# Patient Record
Sex: Female | Born: 2000 | Race: Black or African American | Hispanic: No | Marital: Single | Smoking: Never smoker
Health system: Southern US, Community
[De-identification: ages and names within clinical notes are randomized; demographics above are authoritative.]

## PROBLEM LIST (undated history)

## (undated) DIAGNOSIS — E301 Precocious puberty: Secondary | ICD-10-CM

## (undated) DIAGNOSIS — S83004D Unspecified dislocation of right patella, subsequent encounter: Secondary | ICD-10-CM

## (undated) HISTORY — PX: SUPPRELIN IMPLANT: SHX5166

## (undated) HISTORY — PX: SUPPRELIN REMOVAL: SHX6104

---

## 2019-07-16 ENCOUNTER — Ambulatory Visit (INDEPENDENT_AMBULATORY_CARE_PROVIDER_SITE_OTHER): Payer: PRIVATE HEALTH INSURANCE | Admitting: Orthopedic Surgery

## 2019-07-16 DIAGNOSIS — M25461 Effusion, right knee: Secondary | ICD-10-CM

## 2019-07-19 ENCOUNTER — Other Ambulatory Visit: Payer: Self-pay

## 2019-07-19 ENCOUNTER — Encounter: Payer: Self-pay | Admitting: Orthopedic Surgery

## 2019-07-19 ENCOUNTER — Telehealth: Payer: Self-pay

## 2019-07-19 ENCOUNTER — Ambulatory Visit (INDEPENDENT_AMBULATORY_CARE_PROVIDER_SITE_OTHER): Payer: PRIVATE HEALTH INSURANCE | Admitting: Orthopedic Surgery

## 2019-07-19 DIAGNOSIS — M25561 Pain in right knee: Secondary | ICD-10-CM

## 2019-07-19 NOTE — Telephone Encounter (Signed)
Order for this [facility] student put in for MRI right knee eval for tear

## 2019-07-19 NOTE — Telephone Encounter (Signed)
Done. thanks

## 2019-07-19 NOTE — Telephone Encounter (Signed)
Please dictate ASAP

## 2019-07-19 NOTE — Progress Notes (Signed)
Office Visit Note   Patient: Mary [name]           Date of Birth: 2001-08-07           MRN: [id number] Visit Date: 07/19/2019 Requested by: No referring provider defined for this encounter. PCP: Patient, No Pcp Per  Subjective: Chief Complaint  Patient presents with  . Right Knee - Pain    HPI: Patient presents with right knee pain.  She sustained an injury playing basketball about 5 days ago.  Went up for a rebound and came down and sustained a valgus type collapse with immediate onset of right knee swelling.  She was unable to weight-bear.  Evaluation in the training room demonstrated intact radiographs on the fluoroscopy machine with effusion and possible ACL laxity.  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  She has not been able to weight-bear.  No personal or family history of DVT or pulmonary embolism.              ROS: All systems reviewed are negative as they relate to the chief complaint within the history of present illness.  Patient denies  fevers or chills.   Assessment & Plan: Visit Diagnoses:  1. Right knee pain, unspecified chronicity     Plan: Impression is right knee pain with effusion following valgus collapse type injury.  We aspirated the knee today and got about 40 cc of bloody fluid out.  After that anesthetic injection into the knee joint for the aspiration she does have some anterior laxity.  Collaterals feel stable.  Plan is MRI scan to evaluate source of bloody effusion.  Could be patellar instability ACL tear or meniscal pathology or occult lateral tibial plateau fracture.  She did have a relatively acute onset of swelling.  No calf tenderness today.  Follow-Up Instructions: Return for after MRI.   Orders:  Orders Placed This Encounter  Procedures  . MR Knee Right w/o contrast   No orders of the defined types were placed in this encounter.     Procedures: No procedures performed   Clinical Data: No  additional findings.  Objective: Vital Signs: There were no vitals taken for this visit.  Physical Exam:   Constitutional: Patient appears well-developed HEENT:  Head: Normocephalic Eyes:EOM are normal Neck: Normal range of motion Cardiovascular: Normal rate Pulmonary/chest: Effort normal Neurologic: Patient is alert Skin: Skin is warm Psychiatric: Patient has normal mood and affect    Ortho Exam: Ortho exam demonstrates palpable pedal pulses with intact ankle dorsiflexion.  Extensor mechanism is intact.  Only has about 40 degrees of flexion but does have full extension.  Collaterals feel stable on exam but it is somewhat guarded.  She does have some ACL laxity on the right compared to the left.  Negative patellar apprehension and no discrete tenderness at the medial aspect of the patella but there is some tenderness over the medial epicondyle.  Specialty Comments:  No specialty comments available.  Imaging: No results found.   PMFS History: There are no active problems to display for this patient.  History reviewed. No pertinent past medical history.  History reviewed. No pertinent family history.  History reviewed. No pertinent surgical history. Social History   Occupational History  . Not on file  Tobacco Use  . Smoking status: Not on file  Substance and Sexual Activity  . Alcohol use: Not on file  . Drug use: Not on file  . Sexual activity: Not on file

## 2019-07-19 NOTE — Telephone Encounter (Signed)
I need this office note ASAP please if you will tell him, so I can complete the auth.   I have started this case for PA with [employer] - case # [id number].

## 2019-07-20 ENCOUNTER — Encounter: Payer: Self-pay | Admitting: Orthopedic Surgery

## 2019-07-20 NOTE — Telephone Encounter (Signed)
See note from Dr Dean.  

## 2019-07-20 NOTE — Telephone Encounter (Signed)
Per online, clinicals received pending review- auth pending.  Will follow up

## 2019-07-20 NOTE — Progress Notes (Signed)
   Post-Op Visit Note   Patient: Mary [name]           Date of Birth: 31-Jan-2001           MRN: [id number] Visit Date: 07/16/2019 PCP: Patient, No Pcp Per   Assessment & Plan:  Chief Complaint: No chief complaint on file.  Visit Diagnoses:  1. Effusion, right knee     Plan: Patient presents with injury to the right knee.  She is a Technical brewer.  Has a history of left knee patellar subluxation.  No history of right knee patellar subluxation.  She landed on teammates foot about 3 days ago.  Radiographs reviewed are negative for obvious fracture.  She did have immediate onset of swelling which did go down the leg into the ankle region.  She has been nonweightbearing.  On exam she has moderate effusion with some laxity to ACL testing but is difficult to assess because of her guarding.  Pedal pulses are palpable.  Did have a little bit of paresthesias in the dorsal foot at the onset of injury but those have resolved.  Ankle dorsiflexion strength is symmetric to the left-hand side at this time.  No real patellar apprehension but she does have some medial patellar tenderness but no tenderness over the medial epicondyle.  Range of motion is full extension to only about 40 degrees of flexion.  Impression is right knee injury with effusion.  Could be patellar dislocation ACL injury meniscal injury or occult lateral tibial plateau fracture.  Was a valgus type collapse of the knee when she landed on the teammates foot.  Plan MRI scan with possible aspiration of the knee later in clinic this week.  Need MRI scan to evaluate the source of the effusion.  We will see her back after that study in the training room.  Follow-Up Instructions: Return for after MRI.   Orders:  No orders of the defined types were placed in this encounter.  No orders of the defined types were placed in this encounter.   Imaging: No results found.  PMFS History: There are no active problems to display for  this patient.  History reviewed. No pertinent past medical history.  History reviewed. No pertinent family history.  History reviewed. No pertinent surgical history. Social History   Occupational History  . Not on file  Tobacco Use  . Smoking status: Never Smoker  . Smokeless tobacco: Never Used  Substance and Sexual Activity  . Alcohol use: Not on file  . Drug use: Not on file  . Sexual activity: Not on file

## 2019-07-23 NOTE — Telephone Encounter (Signed)
When I look online it says nurse review, but there is also a P2P availability button.  First time I have used [employer] and not gotten an auth soon, so I am not sure if I am doing something wrong.  Case number is below.  I have not reached out to [facility] yet but that is where I was going to schedule it.  This one came to me through a regular appt, not a request from an AT.  So I guess we would call the patient directly to schedule.  If you will look into and complete this one I would most greatly appreciate you!!  Thanks-

## 2019-08-01 ENCOUNTER — Other Ambulatory Visit: Payer: Self-pay

## 2019-08-01 ENCOUNTER — Ambulatory Visit (INDEPENDENT_AMBULATORY_CARE_PROVIDER_SITE_OTHER): Payer: PRIVATE HEALTH INSURANCE | Admitting: Orthopedic Surgery

## 2019-08-01 DIAGNOSIS — S83004D Unspecified dislocation of right patella, subsequent encounter: Secondary | ICD-10-CM | POA: Diagnosis not present

## 2019-08-02 ENCOUNTER — Encounter: Payer: Self-pay | Admitting: Orthopedic Surgery

## 2019-08-02 NOTE — Progress Notes (Signed)
Office Visit Note   Patient: Mary [name]           Date of Birth: May 23, 2001           MRN: [id number] Visit Date: 08/01/2019 Requested by: No referring provider defined for this encounter. PCP: Patient, No Pcp Per  Subjective: Chief Complaint  Patient presents with  . Follow-up    HPI: Patient presents for evaluation of her right knee.  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  Evaluated in the training room with effusion.  Differential diagnosis at that time was ACL injury versus patellar dislocation versus tibial plateau fracture.  Plain radiographs normal.  MRI scan does show evidence of transient patellar dislocation with lateral femoral condyle osteochondral injury as well as patella chondral injury.  The patella chondral injury is pretty significant crater involving the lateral facet of the patella.  Loose chondral fragment is present within this crater.  Does not look like it is an osteochondral fragment.  Loose bodies also present in the knee from the injury.            ROS: All systems reviewed are negative as they relate to the chief complaint within the history of present illness.  Patient denies  fevers or chills.   Assessment & Plan: Visit Diagnoses:  1. Patellar dislocation, right, subsequent encounter     Plan: Impression is right knee patellar dislocation with fairly reasonable feeling medial patellofemoral ligament today.  Effusion is present and she is achieving a range of motion of about 80 degrees.  Hard to know exactly the size of this osteochondral defect as well as the amount of articular surface involved in both the patella and the lateral femoral condyle.  Plan at this time is arthroscopic evaluation with removal of loose body and better sizing of the lesion.  She may be a candidate for bio unipatellar resurfacing.  Bio cartilage would be another option for the patella.  Another?  For this patient is this nature and size  of the chondral defect on the lateral femoral condyle which is not really discernible from the MRI scan at this time.  Plan for arthroscopic evaluation with removal of loose bodies within the next week or 2.  I do want the patient to work on achieving a little bit more flexion prior to surgery.  Follow-Up Instructions: No follow-ups on file.   Orders:  No orders of the defined types were placed in this encounter.  No orders of the defined types were placed in this encounter.     Procedures: No procedures performed   Clinical Data: No additional findings.  Objective: Vital Signs: There were no vitals taken for this visit.  Physical Exam:   Constitutional: Patient appears well-developed HEENT:  Head: Normocephalic Eyes:EOM are normal Neck: Normal range of motion Cardiovascular: Normal rate Pulmonary/chest: Effort normal Neurologic: Patient is alert Skin: Skin is warm Psychiatric: Patient has normal mood and affect    Ortho Exam: Ortho exam demonstrates mild effusion right knee.  Extensor mechanism is intact.  She has about 85 degrees of flexion which is an improvement.  Does have full extension.  Is able to weight-bear.  Collaterals are stable.  MRI scan is reviewed and it does show intact menisci and ACL and PCL with evidence of patellar dislocation.  Medial patellofemoral ligament looks stretched.  Not much in the way of tenderness over the medial femoral epicondyle or medial aspect of the patella.  Some patellar apprehension today but in general she does not have as much lateral laxity of the patella as I would expect with this magnitude of injury.  Specialty Comments:  No specialty comments available.  Imaging: No results found.   PMFS History: There are no active problems to display for this patient.  No past medical history on file.  No family history on file.  No past surgical history on file. Social History   Occupational History  . Not on file  Tobacco  Use  . Smoking status: Never Smoker  . Smokeless tobacco: Never Used  Substance and Sexual Activity  . Alcohol use: Not on file  . Drug use: Not on file  . Sexual activity: Not on file

## 2019-08-07 ENCOUNTER — Other Ambulatory Visit: Payer: Self-pay

## 2019-08-08 ENCOUNTER — Other Ambulatory Visit: Payer: Self-pay

## 2019-08-08 ENCOUNTER — Encounter (HOSPITAL_BASED_OUTPATIENT_CLINIC_OR_DEPARTMENT_OTHER): Payer: Self-pay | Admitting: *Deleted

## 2019-08-09 ENCOUNTER — Encounter (HOSPITAL_BASED_OUTPATIENT_CLINIC_OR_DEPARTMENT_OTHER)
Admission: RE | Admit: 2019-08-09 | Discharge: 2019-08-09 | Disposition: A | Discharge status: Home / Self Care | Payer: 59 | Source: Ambulatory Visit | Attending: Orthopedic Surgery | Admitting: Orthopedic Surgery

## 2019-08-09 DIAGNOSIS — M2341 Loose body in knee, right knee: Secondary | ICD-10-CM | POA: Diagnosis not present

## 2019-08-09 DIAGNOSIS — X58XXXA Exposure to other specified factors, initial encounter: Secondary | ICD-10-CM | POA: Diagnosis not present

## 2019-08-09 DIAGNOSIS — S83004A Unspecified dislocation of right patella, initial encounter: Secondary | ICD-10-CM | POA: Diagnosis present

## 2019-08-09 DIAGNOSIS — Z01812 Encounter for preprocedural laboratory examination: Secondary | ICD-10-CM | POA: Insufficient documentation

## 2019-08-09 DIAGNOSIS — M948X6 Other specified disorders of cartilage, lower leg: Secondary | ICD-10-CM | POA: Insufficient documentation

## 2019-08-09 LAB — CBC
HCT: 35.7 % — ABNORMAL LOW (ref 36.0–46.0)
Hemoglobin: 11.2 g/dL — ABNORMAL LOW (ref 12.0–15.0)
MCH: 26.9 pg (ref 26.0–34.0)
MCHC: 31.4 g/dL (ref 30.0–36.0)
MCV: 85.8 fL (ref 80.0–100.0)
Platelets: 360 10*3/uL (ref 150–400)
RBC: 4.16 MIL/uL (ref 3.87–5.11)
RDW: 15 % (ref 11.5–15.5)
WBC: 6.8 10*3/uL (ref 4.0–10.5)
nRBC: 0 % (ref 0.0–0.2)

## 2019-08-09 LAB — BASIC METABOLIC PANEL
Anion gap: 10 (ref 5–15)
BUN: 10 mg/dL (ref 6–20)
CO2: 25 mmol/L (ref 22–32)
Calcium: 9.6 mg/dL (ref 8.9–10.3)
Chloride: 105 mmol/L (ref 98–111)
Creatinine, Ser: 0.8 mg/dL (ref 0.44–1.00)
GFR calc Af Amer: >60 mL/min (ref >60)
GFR calc non Af Amer: >60 mL/min (ref >60)
Glucose, Bld: 76 mg/dL (ref 70–99)
Potassium: 4.4 mmol/L (ref 3.5–5.1)
Sodium: 140 mmol/L (ref 135–145)

## 2019-08-09 LAB — POCT PREGNANCY, URINE: Preg Test, Ur: NEGATIVE

## 2019-08-09 NOTE — Progress Notes (Signed)

## 2019-08-09 NOTE — Pre-Procedure Instructions (Signed)
I called pt to let her know that she does not need to have another covid test prior to the procedure. She verbalized understanding.

## 2019-08-09 NOTE — Pre-Procedure Instructions (Signed)
Received faxed Covid test result from patient's trainer on a [facility] form. I spoke with Osker Mason, MD, Assistant Director of [place], in order to confirm the positive result, the date of the test and the type of test. The test performed was done onsite at [facility], was a [employer] #[employer] [facility] Covid 2 Rapid Antigen Test. Dr. Olevia Bowens confirmed that patient experienced only mild symptoms with her Covid course. Information regarding the covid test type was given to IP speciatlist, Cheral Marker, who then discussed with Dr.Cynthia Graylon Good. Dr. Graylon Good confirmed that we could use that result without any need to retest. Documentation form placed on patient's chart.Case reviewed with Dr Roger Kill, anesthesiologist, who is comfortable using the Covid result with IP approval.

## 2019-08-10 ENCOUNTER — Other Ambulatory Visit (HOSPITAL_COMMUNITY): Payer: PRIVATE HEALTH INSURANCE

## 2019-08-14 ENCOUNTER — Ambulatory Visit (HOSPITAL_BASED_OUTPATIENT_CLINIC_OR_DEPARTMENT_OTHER)
Admission: RE | Admit: 2019-08-14 | Discharge: 2019-08-14 | Disposition: A | Discharge status: Home / Self Care | Payer: 59 | Attending: Orthopedic Surgery | Admitting: Orthopedic Surgery

## 2019-08-14 ENCOUNTER — Ambulatory Visit (HOSPITAL_BASED_OUTPATIENT_CLINIC_OR_DEPARTMENT_OTHER): Payer: 59 | Admitting: Anesthesiology

## 2019-08-14 ENCOUNTER — Other Ambulatory Visit: Payer: Self-pay

## 2019-08-14 ENCOUNTER — Encounter (HOSPITAL_BASED_OUTPATIENT_CLINIC_OR_DEPARTMENT_OTHER): Payer: Self-pay | Admitting: Orthopedic Surgery

## 2019-08-14 ENCOUNTER — Encounter: Payer: Self-pay | Admitting: Orthopedic Surgery

## 2019-08-14 ENCOUNTER — Encounter (HOSPITAL_BASED_OUTPATIENT_CLINIC_OR_DEPARTMENT_OTHER)
Admission: RE | Disposition: A | Discharge status: Home / Self Care | Payer: Self-pay | Source: Home / Self Care | Attending: Orthopedic Surgery

## 2019-08-14 DIAGNOSIS — S83004A Unspecified dislocation of right patella, initial encounter: Secondary | ICD-10-CM | POA: Diagnosis not present

## 2019-08-14 DIAGNOSIS — S83281D Other tear of lateral meniscus, current injury, right knee, subsequent encounter: Secondary | ICD-10-CM | POA: Diagnosis not present

## 2019-08-14 HISTORY — DX: Precocious puberty: E30.1

## 2019-08-14 HISTORY — PX: KNEE ARTHROSCOPY: SHX127

## 2019-08-14 SURGERY — ARTHROSCOPY, KNEE
Anesthesia: General | Site: Knee | Laterality: Right

## 2019-08-14 MED ORDER — MEPERIDINE HCL 25 MG/ML IJ SOLN: 6.2500 mg | INTRAMUSCULAR | Status: DC | PRN

## 2019-08-14 MED ORDER — MORPHINE SULFATE (PF) 4 MG/ML IV SOLN
INTRAVENOUS | Status: DC | PRN
  Administered 2019-08-14: 8 mg

## 2019-08-14 MED ORDER — FENTANYL CITRATE (PF) 100 MCG/2ML IJ SOLN
INTRAMUSCULAR | Status: AC
  Filled 2019-08-14: qty 2

## 2019-08-14 MED ORDER — KETOROLAC TROMETHAMINE 30 MG/ML IJ SOLN
INTRAMUSCULAR | Status: DC | PRN
  Administered 2019-08-14: 30 mg via INTRAVENOUS

## 2019-08-14 MED ORDER — PROPOFOL 10 MG/ML IV BOLUS
INTRAVENOUS | Status: AC
  Filled 2019-08-14: qty 20

## 2019-08-14 MED ORDER — METHYLPREDNISOLONE ACETATE 40 MG/ML IJ SUSP
INTRAMUSCULAR | Status: AC
  Filled 2019-08-14: qty 1

## 2019-08-14 MED ORDER — ASPIRIN EC 81 MG PO TBEC: 81.0000 mg | DELAYED_RELEASE_TABLET | Freq: Every day | ORAL | 0 refills | Status: DC

## 2019-08-14 MED ORDER — ONDANSETRON HCL 4 MG/2ML IJ SOLN
INTRAMUSCULAR | Status: DC | PRN
  Administered 2019-08-14: 4 mg via INTRAVENOUS

## 2019-08-14 MED ORDER — MIDAZOLAM HCL 5 MG/5ML IJ SOLN
INTRAMUSCULAR | Status: DC | PRN
  Administered 2019-08-14: 2 mg via INTRAVENOUS

## 2019-08-14 MED ORDER — OXYCODONE HCL 5 MG PO TABS: 5.0000 mg | ORAL_TABLET | ORAL | 0 refills | Status: DC | PRN

## 2019-08-14 MED ORDER — DEXMEDETOMIDINE HCL IN NACL 200 MCG/50ML IV SOLN
INTRAVENOUS | Status: DC | PRN
  Administered 2019-08-14: 4 ug via INTRAVENOUS
  Administered 2019-08-14 (×3): 8 ug via INTRAVENOUS
  Administered 2019-08-14: 12 ug via INTRAVENOUS

## 2019-08-14 MED ORDER — CHLORHEXIDINE GLUCONATE 4 % EX LIQD: 60.0000 mL | Freq: Once | CUTANEOUS | Status: DC

## 2019-08-14 MED ORDER — ONDANSETRON HCL 4 MG/2ML IJ SOLN
INTRAMUSCULAR | Status: AC
  Filled 2019-08-14: qty 4

## 2019-08-14 MED ORDER — MIDAZOLAM HCL 2 MG/2ML IJ SOLN: 1.0000 mg | INTRAMUSCULAR | Status: DC | PRN

## 2019-08-14 MED ORDER — PROMETHAZINE HCL 25 MG/ML IJ SOLN: 6.2500 mg | INTRAMUSCULAR | Status: DC | PRN

## 2019-08-14 MED ORDER — LIDOCAINE 2% (20 MG/ML) 5 ML SYRINGE
INTRAMUSCULAR | Status: DC | PRN
  Administered 2019-08-14: 80 mg via INTRAVENOUS

## 2019-08-14 MED ORDER — CEFAZOLIN SODIUM-DEXTROSE 2-4 GM/100ML-% IV SOLN
INTRAVENOUS | Status: AC
  Filled 2019-08-14: qty 100

## 2019-08-14 MED ORDER — FENTANYL CITRATE (PF) 100 MCG/2ML IJ SOLN
INTRAMUSCULAR | Status: DC | PRN
  Administered 2019-08-14: 50 ug via INTRAVENOUS
  Administered 2019-08-14 (×2): 25 ug via INTRAVENOUS
  Administered 2019-08-14: 50 ug via INTRAVENOUS
  Administered 2019-08-14: 25 ug via INTRAVENOUS
  Administered 2019-08-14: 50 ug via INTRAVENOUS
  Administered 2019-08-14 (×3): 25 ug via INTRAVENOUS

## 2019-08-14 MED ORDER — OXYCODONE HCL 5 MG PO TABS: 5.0000 mg | ORAL_TABLET | Freq: Once | ORAL | Status: DC | PRN

## 2019-08-14 MED ORDER — SUCCINYLCHOLINE CHLORIDE 200 MG/10ML IV SOSY
PREFILLED_SYRINGE | INTRAVENOUS | Status: AC
  Filled 2019-08-14: qty 10

## 2019-08-14 MED ORDER — LACTATED RINGERS IV SOLN: INTRAVENOUS | Status: DC

## 2019-08-14 MED ORDER — BUPIVACAINE HCL (PF) 0.5 % IJ SOLN
INTRAMUSCULAR | Status: DC | PRN
  Administered 2019-08-14: 20 mL

## 2019-08-14 MED ORDER — LIDOCAINE-EPINEPHRINE 1 %-1:100000 IJ SOLN
INTRAMUSCULAR | Status: DC | PRN
  Administered 2019-08-14: 20 mL

## 2019-08-14 MED ORDER — SODIUM CHLORIDE 0.9 % IR SOLN
Status: DC | PRN
  Administered 2019-08-14: 4 mL

## 2019-08-14 MED ORDER — LIDOCAINE-EPINEPHRINE 1 %-1:100000 IJ SOLN
INTRAMUSCULAR | Status: AC
  Filled 2019-08-14: qty 1

## 2019-08-14 MED ORDER — PROPOFOL 10 MG/ML IV BOLUS
INTRAVENOUS | Status: DC | PRN
  Administered 2019-08-14: 50 mg via INTRAVENOUS
  Administered 2019-08-14: 200 mg via INTRAVENOUS

## 2019-08-14 MED ORDER — CEFAZOLIN SODIUM-DEXTROSE 2-4 GM/100ML-% IV SOLN
2.0000 g | INTRAVENOUS | Status: AC
  Administered 2019-08-14: 2 g via INTRAVENOUS

## 2019-08-14 MED ORDER — DEXAMETHASONE SODIUM PHOSPHATE 10 MG/ML IJ SOLN
INTRAMUSCULAR | Status: DC | PRN
  Administered 2019-08-14: 10 mg via INTRAVENOUS

## 2019-08-14 MED ORDER — OXYCODONE HCL 5 MG/5ML PO SOLN: 5.0000 mg | Freq: Once | ORAL | Status: DC | PRN

## 2019-08-14 MED ORDER — EPHEDRINE 5 MG/ML INJ
INTRAVENOUS | Status: AC
  Filled 2019-08-14: qty 30

## 2019-08-14 MED ORDER — MORPHINE SULFATE (PF) 4 MG/ML IV SOLN
INTRAVENOUS | Status: AC
  Filled 2019-08-14: qty 2

## 2019-08-14 MED ORDER — FENTANYL CITRATE (PF) 100 MCG/2ML IJ SOLN: 50.0000 ug | INTRAMUSCULAR | Status: DC | PRN

## 2019-08-14 MED ORDER — METHOCARBAMOL 500 MG PO TABS: 500.0000 mg | ORAL_TABLET | Freq: Four times a day (QID) | ORAL | 0 refills | Status: DC

## 2019-08-14 MED ORDER — HYDROMORPHONE HCL 1 MG/ML IJ SOLN: 0.2500 mg | INTRAMUSCULAR | Status: DC | PRN

## 2019-08-14 MED ORDER — MIDAZOLAM HCL 2 MG/2ML IJ SOLN
INTRAMUSCULAR | Status: AC
  Filled 2019-08-14: qty 2

## 2019-08-14 MED ORDER — PHENYLEPHRINE 40 MCG/ML (10ML) SYRINGE FOR IV PUSH (FOR BLOOD PRESSURE SUPPORT)
PREFILLED_SYRINGE | INTRAVENOUS | Status: AC
  Filled 2019-08-14: qty 10

## 2019-08-14 SURGICAL SUPPLY — 57 items
BANDAGE ESMARK 6X9 LF (GAUZE/BANDAGES/DRESSINGS) IMPLANT
BLADE EXCALIBUR 4.0MM X 13CM (MISCELLANEOUS)
BLADE EXCALIBUR 4.0X13 (MISCELLANEOUS) IMPLANT
BLADE SURG 15 STRL LF DISP TIS (BLADE) IMPLANT
BLADE SURG 15 STRL SS (BLADE)
BNDG ELASTIC 4X5.8 VLCR STR LF (GAUZE/BANDAGES/DRESSINGS) ×3 IMPLANT
BNDG ELASTIC 6X5.8 VLCR STR LF (GAUZE/BANDAGES/DRESSINGS) ×3 IMPLANT
BNDG ESMARK 6X9 LF (GAUZE/BANDAGES/DRESSINGS)
BURR OVAL 8 FLU 4.0MM X 13CM (MISCELLANEOUS) ×1
BURR OVAL 8 FLU 4.0X13 (MISCELLANEOUS) ×2 IMPLANT
COVER WAND RF STERILE (DRAPES) IMPLANT
CUFF TOURN SGL QUICK 34 (TOURNIQUET CUFF)
CUFF TRNQT CYL 34X4.125X (TOURNIQUET CUFF) IMPLANT
DISSECTOR  3.8MM X 13CM (MISCELLANEOUS) ×2
DISSECTOR 3.8MM X 13CM (MISCELLANEOUS) ×1 IMPLANT
DRAPE ARTHROSCOPY W/POUCH 90 (DRAPES) ×3 IMPLANT
DRAPE INCISE IOBAN 66X45 STRL (DRAPES) IMPLANT
DRAPE U-SHAPE 47X51 STRL (DRAPES) ×6 IMPLANT
DRAPE U-SHAPE 76X120 STRL (DRAPES) ×3 IMPLANT
DRSG TEGADERM 2-3/8X2-3/4 SM (GAUZE/BANDAGES/DRESSINGS) ×3 IMPLANT
DRSG TEGADERM 4X4.75 (GAUZE/BANDAGES/DRESSINGS) ×6 IMPLANT
DURAPREP 26ML APPLICATOR (WOUND CARE) ×3 IMPLANT
DW OUTFLOW CASSETTE/TUBE SET (MISCELLANEOUS) ×3 IMPLANT
ELECT REM PT RETURN 9FT ADLT (ELECTROSURGICAL)
ELECTRODE REM PT RTRN 9FT ADLT (ELECTROSURGICAL) IMPLANT
EXCALIBUR 3.8MM X 13CM (MISCELLANEOUS) IMPLANT
GAUZE 4X4 16PLY RFD (DISPOSABLE) IMPLANT
GAUZE SPONGE 4X4 12PLY STRL (GAUZE/BANDAGES/DRESSINGS) ×3 IMPLANT
GAUZE XEROFORM 1X8 LF (GAUZE/BANDAGES/DRESSINGS) ×3 IMPLANT
GAUZE XEROFORM 5X9 LF (GAUZE/BANDAGES/DRESSINGS) ×3 IMPLANT
GLOVE BIOGEL PI IND STRL 8 (GLOVE) ×1 IMPLANT
GLOVE BIOGEL PI INDICATOR 8 (GLOVE) ×2
GLOVE SURG ORTHO 8.0 STRL STRW (GLOVE) ×3 IMPLANT
GOWN STRL REUS W/ TWL LRG LVL3 (GOWN DISPOSABLE) ×1 IMPLANT
GOWN STRL REUS W/ TWL XL LVL3 (GOWN DISPOSABLE) ×1 IMPLANT
GOWN STRL REUS W/TWL LRG LVL3 (GOWN DISPOSABLE) ×2
GOWN STRL REUS W/TWL XL LVL3 (GOWN DISPOSABLE) ×2
HOLDER KNEE FOAM BLUE (MISCELLANEOUS) IMPLANT
KIT CARTILAGE BIOPSY (MISCELLANEOUS) ×3 IMPLANT
KNEE WRAP E Z 3 GEL PACK (MISCELLANEOUS) ×3 IMPLANT
MANIFOLD NEPTUNE II (INSTRUMENTS) ×3 IMPLANT
NDL SAFETY ECLIPSE 18X1.5 (NEEDLE) ×2 IMPLANT
NEEDLE HYPO 18GX1.5 BLUNT FILL (NEEDLE) ×3 IMPLANT
NEEDLE HYPO 18GX1.5 SHARP (NEEDLE) ×4
PACK ARTHROSCOPY DSU (CUSTOM PROCEDURE TRAY) ×3 IMPLANT
PACK BASIN DAY SURGERY FS (CUSTOM PROCEDURE TRAY) ×3 IMPLANT
PADDING CAST COTTON 6X4 STRL (CAST SUPPLIES) ×3 IMPLANT
PENCIL SMOKE EVACUATOR (MISCELLANEOUS) IMPLANT
PORT APPOLLO RF 90DEGREE MULTI (SURGICAL WAND) IMPLANT
SUT ETHILON 3 0 PS 1 (SUTURE) ×3 IMPLANT
SUT MENISCAL KIT (KITS) IMPLANT
SUT VIC AB 3-0 FS2 27 (SUTURE) IMPLANT
SYR 5ML LL (SYRINGE) ×3 IMPLANT
SYR TB 1ML LL NO SAFETY (SYRINGE) ×3 IMPLANT
TOWEL GREEN STERILE FF (TOWEL DISPOSABLE) ×3 IMPLANT
TRAY DSU PREP LF (CUSTOM PROCEDURE TRAY) ×3 IMPLANT
TUBING ARTHROSCOPY IRRIG 16FT (MISCELLANEOUS) ×3 IMPLANT

## 2019-08-14 NOTE — H&P (Signed)
Mary [name] is an 18 y.o. female.   Chief Complaint: Right knee chondral defect and patellar instability HPI: Patient presents with right knee patellar dislocation approximately 4 weeks ago.  MRI scan confirms chondral defect and possible osteochondral defect of the patella with loose body within the knee joint lateral gutter.  Patient has had instability on the left patellofemoral joint.  This episode of instability in the right knee was more severe than previously.  She denies any personal or family history of DVT or pulmonary embolism.  Past Medical History:  Diagnosis Date  . Precocious puberty     Past Surgical History:  Procedure Laterality Date  . SUPPRELIN IMPLANT    . SUPPRELIN REMOVAL      History reviewed. No pertinent family history. Social History:  reports that she has never smoked. She has never used smokeless tobacco. She reports that she does not drink alcohol or use drugs.  Allergies: No Known Allergies  No medications prior to admission.    No results found for this or any previous visit (from the past 48 hour(s)). No results found.  Review of Systems  Musculoskeletal: Positive for arthralgias.  All other systems reviewed and are negative.   Blood pressure 140/77, pulse 66, temperature (!) 96.9 F (36.1 C), temperature source Tympanic, resp. rate 18, height 6' (1.829 m), weight 111 kg, last menstrual period 08/14/2019, SpO2 100 %. Physical Exam  Constitutional: She appears well-developed.  HENT:  Head: Normocephalic.  Eyes: Pupils are equal, round, and reactive to light.  Cardiovascular: Normal rate.  Respiratory: Effort normal.  Musculoskeletal:     Cervical back: Normal range of motion.  Neurological: She is alert.  Skin: Skin is warm.  Psychiatric: She has a normal mood and affect.  Examination of the right knee demonstrates mild effusion with stable collateral crucial ligaments.  Not much in the way of patellar apprehension.  Slightly more  lateral mobility on the right compared to the left.  No coarse grinding or crepitus with range of motion but she only has about 80 degrees of flexion.  Pedal pulses palpable.  Assessment/Plan Impression is right knee patellar dislocation and instability with chondral defect at least on the patellar medial facet possibly on the lateral femoral condyle.  Loose body is present.  Plan at this time is operative removal of the loose body with examination of the patellar surface.  On the MRI scan there was an osteochondral fragment which had some undermining which may have healed.  Operative plan for this problem would be either osteochondral allograft versus MACI versus debridement and microfracture.  We may need to harvest cartilage cells today if we are planning for MACI.  Risk benefits of the procedure discussed with the patient including not limited to infection nerve or vessel damage knee stiffness as well as her definite need most likely for more surgery.  Patient understands risk benefits and wishes to proceed.  Also discussed this with her mother in the clinic.  All questions answered  Anderson Malta, MD 08/14/2019, 2:54 PM

## 2019-08-14 NOTE — Op Note (Signed)
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  POSTOPERATIVE DIAGNOSIS:  Right knee patellar instability with patellar chondral defect, multiple loose bodies and lateral meniscal tear.  PROCEDURE:  Right knee arthroscopy with loose body removal, partial lateral meniscectomy and chondral harvest for MACI.    SURGEON ATTENDING SURGEON:  Alphonzo Severance, MD  ASSISTANT:  Annie Main  INDICATIONS:  The patient is a 18 year old patient with right knee pain following patellar dislocation about 4 weeks ago.  Presents now for operative management after explanation of risks and benefits.  PROCEDURE IN DETAIL:  The patient was brought to the operating room where general anesthetic was induced.  Preoperative antibiotics administered.  Timeout was called.  Right knee was examined under anesthesia and found to have good stability to varus and  valgus stress at 0 and 30 degrees.  The patient had fairly significant patellar mobility laterally in both knees.  I could not dislocate the patella, both in full extension 4 and 30 degrees of flexion, but the patient did have about 2 cm of translation  laterally bilaterally.  Right knee was pre-scrubbed with alcohol and Betadine.  Timeout was called.  Anterior inferior lateral portal was established, anterior inferior medial portal was established under direct visualization.  Diagnostic arthroscopy was  performed.  The patient had lateral meniscal tear, which was debrided using a combination of basket punch and shaver.  This involved only about 20% of the anterior, posterior width of the meniscus.  Medial compartment was intact including the menisci  and articular surfaces.  The patient did have some chondral damage on the  lateral femoral condyle, which was partial thickness grade II over about an 8 x 8 mm area which was also debrided.  The ACL, PCL were intact.  The patient did have a loose body x2  within the lateral gutter and these were removed.  These loose bodies measured approximately 1.5 x 1.5 cm and 1.5 x 1 cm.  They were the chondral cartilage from the patella.  On the undersurface of the patella, a chondral lesion measuring approximately  2.5 x 2 cm was encountered.  This did cross the medial and lateral facets.  Debridement was performed in this region.  Following this, the decision was made to proceed with MACI in 4 weeks.  The chondral harvest was performed from the lateral femoral  intercondylar notch.  This was sent off to ____.  Following this, the knee joint was thoroughly irrigated.  Instruments were removed.  Portals were closed using 3-0 nylon.  A solution of Marcaine, morphine, clonidine injected into the knee for postop  pain relief.  Watertight dressings applied.  The bandage was also applied.  The patient tolerated the procedure well without immediate complications.  She was transferred to the recovery room in stable condition.  Plan for surgery in 4 weeks.  [name]'s assistance was required at all times for opening and closing graft management.  His assistance was of medical necessity.  TN/NUANCE  D:08/14/2019 T:08/14/2019 JOB:[id number]

## 2019-08-14 NOTE — Anesthesia Preprocedure Evaluation (Signed)
Anesthesia Evaluation  Patient identified by MRN, date of birth, ID band Patient awake    Reviewed: Allergy & Precautions, NPO status , Patient's Chart, lab work & pertinent test results  Airway Mallampati: II  TM Distance: >3 FB Neck ROM: Full    Dental no notable dental hx.    Pulmonary neg pulmonary ROS,    Pulmonary exam normal breath sounds clear to auscultation       Cardiovascular negative cardio ROS Normal cardiovascular exam Rhythm:Regular Rate:Normal     Neuro/Psych negative neurological ROS  negative psych ROS   GI/Hepatic negative GI ROS, Neg liver ROS,   Endo/Other  negative endocrine ROS  Renal/GU negative Renal ROS  negative genitourinary   Musculoskeletal negative musculoskeletal ROS (+)   Abdominal   Peds negative pediatric ROS (+)  Hematology negative hematology ROS (+)   Anesthesia Other Findings   Reproductive/Obstetrics negative OB ROS                             Anesthesia Physical Anesthesia Plan  ASA: I  Anesthesia Plan: General   Post-op Pain Management:    Induction: Intravenous  PONV Risk Score and Plan: 3 and Ondansetron, Dexamethasone, Midazolam and Treatment may vary due to age or medical condition  Airway Management Planned: LMA  Additional Equipment:   Intra-op Plan:   Post-operative Plan: Extubation in OR  Informed Consent: I have reviewed the patients History and Physical, chart, labs and discussed the procedure including the risks, benefits and alternatives for the proposed anesthesia with the patient or authorized representative who has indicated his/her understanding and acceptance.     Dental advisory given  Plan Discussed with: CRNA  Anesthesia Plan Comments:         Anesthesia Quick Evaluation  

## 2019-08-14 NOTE — Transfer of Care (Signed)
Immediate Anesthesia Transfer of Care Note  Patient: Mary [name]  Procedure(s) Performed: right knee arthrosopy, removal of loose bodies (Right Knee)  Patient Location: PACU  Anesthesia Type:General  Level of Consciousness: awake, alert  and oriented  Airway & Oxygen Therapy: Patient Spontanous Breathing and Patient connected to face mask oxygen  Post-op Assessment: Report given to RN and Post -op Vital signs reviewed and stable  Post vital signs: Reviewed and stable  Last Vitals:  Vitals Value Taken Time  BP 137/74 08/14/19 1651  Temp    Pulse 95 08/14/19 1652  Resp 18 08/14/19 1652  SpO2 100 % 08/14/19 1652  Vitals shown include unvalidated device data.  Last Pain:  Vitals:   08/14/19 1301  TempSrc: Tympanic  PainSc: 0-No pain      Patients Stated Pain Goal: 3 (62/22/97 [id number])  Complications: No apparent anesthesia complications

## 2019-08-14 NOTE — Anesthesia Postprocedure Evaluation (Signed)
Anesthesia Post Note  Patient: Mary [name]  Procedure(s) Performed: right knee arthrosopy, removal of loose bodies (Right Knee)     Patient location during evaluation: PACU Anesthesia Type: General Level of consciousness: awake and alert Pain management: pain level controlled Vital Signs Assessment: post-procedure vital signs reviewed and stable Respiratory status: spontaneous breathing, nonlabored ventilation and respiratory function stable Cardiovascular status: blood pressure returned to baseline and stable Postop Assessment: no apparent nausea or vomiting Anesthetic complications: no    Last Vitals:  Vitals:   08/14/19 1651 08/14/19 1700  BP: 137/74 140/71  Pulse: 93 85  Resp: 14 12  Temp: 36.8 C   SpO2: 100% 100%    Last Pain:  Vitals:   08/14/19 1700  TempSrc:   PainSc: 0-No pain                 Lynda Rainwater

## 2019-08-14 NOTE — Op Note (Signed)
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  PROCEDURE:  Right knee arthroscopy with removal of multiple loose bodies with chondral harvesting for MACI.  SURGEON:  Meredith Pel, MD  ASSISTANT:  Annie Main.  INDICATIONS:  This is a 18 year old patient with right knee pain sustained after dislocating her patella several weeks ago.  She presents now for operative management after explanation of risks and benefits.  She has loose bodies within the knee  consistent with her chondral injury.  PROCEDURE IN DETAIL:  The patient was brought to the operating room where general anesthetic was induced.  Preoperative antibiotics administered.  Timeout was called.  Right leg prescrubbed with alcohol and Betadine, allowed to air dry.  Prep with  DuraPrep solution and draped in a sterile manner.  [name] was not utilized.  Anterior inferolateral and anterior inferomedial portals were established.  Diagnostic arthroscopy was performed.  The patient had intact medial and lateral menisci with intact  medial and lateral tibial plateau.  Intact ACL.  The patient had a partial thickness chondral damage on that lateral femoral condyle, but no full thickness loss was present.  This was debrided.  This was done with the shaver.  The patient also had a  loose body within the lateral gutter, which was removed.  These 2 loose bodies measured approximately 1.5 x 1.5 cm x 1 and 1 x 1 cm x 2 plus other smaller loose bodies.  Following removal of the loose bodies, inspection was made of the donor site   undersurface of the patella.  A well-shouldered lesion was present, but it measured including devitalized appearing frayed cartilage about 2.5 cm medial to lateral x 2 cm anterior to posterior.  Loose pieces were removed.  Microfracture was not  performed.  Chondral harvesting was then performed from the lateral femoral condyle region.  This was sent off to ____ per instructions.  Thorough irrigation of the knee joint was performed.  A solution of Marcaine, morphine, clonidine injected into the  knee for postop pain relief and portals closed using 3-0 nylon.  Impervious dressings applied plus Ace wrap.  The patient tolerated the procedure well without immediate complications.  Anticipate return to the OR in 4 weeks for reimplantation and  possible medial patellofemoral ligament reconstruction.  [name]'s assistance was required at all times for opening and closing as well as manipulation of the knee.  His assistance was of medical necessity.  TN/NUANCE  D:08/14/2019 T:08/14/2019 JOB:[id number]

## 2019-08-14 NOTE — Discharge Instructions (Signed)
No Ibuprofen until 11:00pm if needed   Post Anesthesia Home Care Instructions  Activity: Get plenty of rest for the remainder of the day. A responsible individual must stay with you for 24 hours following the procedure.  For the next 24 hours, DO NOT: -Drive a car -Paediatric nurse -Drink alcoholic beverages -Take any medication unless instructed by your physician -Make any legal decisions or sign important papers.  Meals: Start with liquid foods such as gelatin or soup. Progress to regular foods as tolerated. Avoid greasy, spicy, heavy foods. If nausea and/or vomiting occur, drink only clear liquids until the nausea and/or vomiting subsides. Call your physician if vomiting continues.  Special Instructions/Symptoms: Your throat may feel dry or sore from the anesthesia or the breathing tube placed in your throat during surgery. If this causes discomfort, gargle with warm salt water. The discomfort should disappear within 24 hours.  If you had a scopolamine patch placed behind your ear for the management of post- operative nausea and/or vomiting:  1. The medication in the patch is effective for 72 hours, after which it should be removed.  Wrap patch in a tissue and discard in the trash. Wash hands thoroughly with soap and water. 2. You may remove the patch earlier than 72 hours if you experience unpleasant side effects which may include dry mouth, dizziness or visual disturbances. 3. Avoid touching the patch. Wash your hands with soap and water after contact with the patch.

## 2019-08-14 NOTE — Brief Op Note (Addendum)
   08/14/2019  4:41 PM  PATIENT:  Mary [name]  18 y.o. female  PRE-OPERATIVE DIAGNOSIS:  right knee loose bodies  POST-OPERATIVE DIAGNOSIS:  right knee loose bodies, lateral meniscal tear  PROCEDURE:  Procedure(s): right knee arthrosopy, removal of loose bodies,cartilage harvest for MACI, partial lateral meniscectomy  SURGEON:  Surgeon(s): Marlou Sa, Tonna Corner, MD  ASSISTANT: Annie Main pa  ANESTHESIA:   general  EBL: 5 ml    Total I/O In: 1000 [I.V.:1000] Out: -   BLOOD ADMINISTERED: none  DRAINS: none   LOCAL MEDICATIONS USED: Marcaine morphine clonidine  SPECIMEN:  No Specimen  COUNTS:  YES  TOURNIQUET:  * No tourniquets in log *  DICTATION: .Other Dictation: Dictation Number [id number],  PLAN OF CARE: Discharge to home after PACU  PATIENT DISPOSITION:  PACU - hemodynamically stable

## 2019-08-14 NOTE — Anesthesia Procedure Notes (Signed)
Procedure Name: LMA Insertion Date/Time: 08/14/2019 3:21 PM Performed by: Genelle Bal, CRNA Pre-anesthesia Checklist: Patient identified, Emergency Drugs available, Suction available and Patient being monitored Patient Re-evaluated:Patient Re-evaluated prior to induction Oxygen Delivery Method: Circle system utilized Preoxygenation: Pre-oxygenation with 100% oxygen Induction Type: IV induction Ventilation: Mask ventilation without difficulty LMA: LMA inserted LMA Size: 4.0 Number of attempts: 1 Airway Equipment and Method: Bite block Placement Confirmation: positive ETCO2 Tube secured with: Tape Dental Injury: Teeth and Oropharynx as per pre-operative assessment

## 2019-08-15 ENCOUNTER — Encounter: Payer: Self-pay | Admitting: *Deleted

## 2019-08-22 ENCOUNTER — Ambulatory Visit (INDEPENDENT_AMBULATORY_CARE_PROVIDER_SITE_OTHER): Payer: PRIVATE HEALTH INSURANCE | Admitting: Orthopedic Surgery

## 2019-08-22 ENCOUNTER — Inpatient Hospital Stay: Payer: PRIVATE HEALTH INSURANCE | Admitting: Orthopedic Surgery

## 2019-08-22 ENCOUNTER — Other Ambulatory Visit: Payer: Self-pay

## 2019-08-22 VITALS — Ht 72.0 in | Wt 245.0 lb

## 2019-08-22 DIAGNOSIS — S83004D Unspecified dislocation of right patella, subsequent encounter: Secondary | ICD-10-CM

## 2019-08-23 ENCOUNTER — Encounter: Payer: Self-pay | Admitting: Orthopedic Surgery

## 2019-08-23 NOTE — Progress Notes (Signed)
   Post-Op Visit Note   Patient: Mary [name]           Date of Birth: Aug 11, 2001           MRN: [id number] Visit Date: 08/22/2019 PCP: Janie Morning, DO   Assessment & Plan:  Chief Complaint:  Chief Complaint  Patient presents with  . Right Knee - Follow-up   Visit Diagnoses:  1. Patellar dislocation, right, subsequent encounter     Plan: Tessie Fass is now about 9 days out right knee arthroscopy with loose body removal.  Cartilage sent for weeks.  She has been weightbearing as tolerated and working on range of motion exercises.  On examination today the portal sutures are removed.  That medial portal suture has gapping of about 1 mm.  Steri-Strips applied.  Did aspirate the right knee today to decrease some of the pressure on those arthroscopy portal sites.  No drainage from these portal sites.  Aspirated about 60 cc.  That should help her continue to work on flexion as well as continue to work on extension.  Okay to be weightbearing as tolerated to continue with 1 crutch as needed.  No calf swelling and negative Homans today.  Paperwork is filled out today for bio cartilage implant and possible MPFL reconstruction.  I think that would be important to do to make sure that no further dislocation episodes occur which would disrupt the cartilage implant.  I am going to hold her aspirin for 48 hours.  Okay to resume that when she gets down to [place].  Also hold off on flexion today day of clinic in [place] in order to allow that portal to heal up a little bit more as well.  Lovette Cliche who is the athletic trainer was present for the visit today as well.  She will continue to work on [place] getting the leg as strong as possible with as much range of motion as possible prior to her surgery date in January.  Follow-Up Instructions: Return in about 4 weeks (around 09/19/2019).   Orders:  No orders of the defined types were placed in this encounter.  No orders of the defined types were  placed in this encounter.   Imaging: No results found.  PMFS History: There are no problems to display for this patient.  Past Medical History:  Diagnosis Date  . Precocious puberty     History reviewed. No pertinent family history.  Past Surgical History:  Procedure Laterality Date  . KNEE ARTHROSCOPY Right 08/14/2019   Procedure: right knee arthrosopy, removal of loose bodies;  Surgeon: Meredith Pel, MD;  Location: [facility];  Service: Orthopedics;  Laterality: Right;  . SUPPRELIN IMPLANT    . [place] REMOVAL     Social History   Occupational History  . Not on file  Tobacco Use  . Smoking status: Never Smoker  . Smokeless tobacco: Never Used  Substance and Sexual Activity  . Alcohol use: Never  . Drug use: Never  . Sexual activity: Not on file

## 2019-09-14 ENCOUNTER — Telehealth: Payer: Self-pay | Admitting: Orthopedic Surgery

## 2019-09-14 NOTE — Telephone Encounter (Signed)
I will submit this information to the rep.

## 2019-09-27 ENCOUNTER — Other Ambulatory Visit (HOSPITAL_COMMUNITY): Payer: 59

## 2019-09-27 ENCOUNTER — Telehealth: Payer: Self-pay | Admitting: Orthopedic Surgery

## 2019-09-27 ENCOUNTER — Other Ambulatory Visit: Payer: Self-pay

## 2019-09-27 ENCOUNTER — Encounter (HOSPITAL_BASED_OUTPATIENT_CLINIC_OR_DEPARTMENT_OTHER): Payer: Self-pay | Admitting: Orthopedic Surgery

## 2019-09-27 NOTE — Telephone Encounter (Signed)
Records 07/31/2019-present including MRI report faxed to [facility] training room, Nigel Berthold [phone]

## 2019-10-01 ENCOUNTER — Other Ambulatory Visit (HOSPITAL_COMMUNITY): Payer: Self-pay

## 2019-10-01 ENCOUNTER — Other Ambulatory Visit (HOSPITAL_COMMUNITY)
Admission: RE | Admit: 2019-10-01 | Discharge: 2019-10-01 | Disposition: A | Discharge status: Home / Self Care | Payer: 59 | Source: Ambulatory Visit | Attending: Orthopedic Surgery | Admitting: Orthopedic Surgery

## 2019-10-01 DIAGNOSIS — Z01812 Encounter for preprocedural laboratory examination: Secondary | ICD-10-CM | POA: Diagnosis not present

## 2019-10-01 DIAGNOSIS — Z20822 Contact with and (suspected) exposure to covid-19: Secondary | ICD-10-CM | POA: Insufficient documentation

## 2019-10-01 LAB — SARS CORONAVIRUS 2 (TAT 6-24 HRS): SARS Coronavirus 2: NEGATIVE

## 2019-10-03 ENCOUNTER — Other Ambulatory Visit: Payer: Self-pay

## 2019-10-04 ENCOUNTER — Other Ambulatory Visit: Payer: Self-pay

## 2019-10-04 ENCOUNTER — Ambulatory Visit (HOSPITAL_BASED_OUTPATIENT_CLINIC_OR_DEPARTMENT_OTHER): Payer: 59 | Admitting: Anesthesiology

## 2019-10-04 ENCOUNTER — Encounter (HOSPITAL_BASED_OUTPATIENT_CLINIC_OR_DEPARTMENT_OTHER)
Admission: RE | Disposition: A | Discharge status: Home / Self Care | Payer: Self-pay | Source: Home / Self Care | Attending: Orthopedic Surgery

## 2019-10-04 ENCOUNTER — Ambulatory Visit (HOSPITAL_COMMUNITY): Payer: 59

## 2019-10-04 ENCOUNTER — Ambulatory Visit (HOSPITAL_BASED_OUTPATIENT_CLINIC_OR_DEPARTMENT_OTHER)
Admission: RE | Admit: 2019-10-04 | Discharge: 2019-10-04 | Disposition: A | Discharge status: Home / Self Care | Payer: 59 | Attending: Orthopedic Surgery | Admitting: Orthopedic Surgery

## 2019-10-04 ENCOUNTER — Encounter (HOSPITAL_BASED_OUTPATIENT_CLINIC_OR_DEPARTMENT_OTHER): Payer: Self-pay | Admitting: Orthopedic Surgery

## 2019-10-04 DIAGNOSIS — M25361 Other instability, right knee: Secondary | ICD-10-CM | POA: Diagnosis present

## 2019-10-04 DIAGNOSIS — S83004A Unspecified dislocation of right patella, initial encounter: Secondary | ICD-10-CM | POA: Diagnosis not present

## 2019-10-04 DIAGNOSIS — S83004S Unspecified dislocation of right patella, sequela: Secondary | ICD-10-CM | POA: Diagnosis not present

## 2019-10-04 DIAGNOSIS — X58XXXA Exposure to other specified factors, initial encounter: Secondary | ICD-10-CM | POA: Diagnosis not present

## 2019-10-04 DIAGNOSIS — Y939 Activity, unspecified: Secondary | ICD-10-CM | POA: Insufficient documentation

## 2019-10-04 DIAGNOSIS — M948X6 Other specified disorders of cartilage, lower leg: Secondary | ICD-10-CM | POA: Insufficient documentation

## 2019-10-04 DIAGNOSIS — M2241 Chondromalacia patellae, right knee: Secondary | ICD-10-CM | POA: Diagnosis not present

## 2019-10-04 DIAGNOSIS — Z419 Encounter for procedure for purposes other than remedying health state, unspecified: Secondary | ICD-10-CM

## 2019-10-04 HISTORY — PX: MEDIAL PATELLOFEMORAL LIGAMENT REPAIR: SHX2020

## 2019-10-04 HISTORY — PX: OSTEOCHONDRAL DEFECT REPAIR/RECONSTRUCTION: SHX6232

## 2019-10-04 HISTORY — DX: Unspecified dislocation of right patella, subsequent encounter: S83.004D

## 2019-10-04 LAB — POCT PREGNANCY, URINE: Preg Test, Ur: NEGATIVE

## 2019-10-04 SURGERY — RECONSTRUCTION, LIGAMENT, MEDIAL PATELLOFEMORAL
Anesthesia: General | Site: Knee | Laterality: Right

## 2019-10-04 MED ORDER — MIDAZOLAM HCL 2 MG/2ML IJ SOLN
INTRAMUSCULAR | Status: AC
  Filled 2019-10-04: qty 2

## 2019-10-04 MED ORDER — THROMBIN 5000 UNITS EX SOLR
OROMUCOSAL | Status: DC | PRN
  Administered 2019-10-04: 15:00:00 5 mL via TOPICAL

## 2019-10-04 MED ORDER — 0.9 % SODIUM CHLORIDE (POUR BTL) OPTIME
TOPICAL | Status: DC | PRN
  Administered 2019-10-04: 3000 mL

## 2019-10-04 MED ORDER — LIDOCAINE 2% (20 MG/ML) 5 ML SYRINGE
INTRAMUSCULAR | Status: AC
  Filled 2019-10-04: qty 5

## 2019-10-04 MED ORDER — ACETAMINOPHEN 160 MG/5ML PO SOLN: 325.0000 mg | ORAL | Status: DC | PRN

## 2019-10-04 MED ORDER — FENTANYL CITRATE (PF) 100 MCG/2ML IJ SOLN
INTRAMUSCULAR | Status: AC
  Filled 2019-10-04: qty 2

## 2019-10-04 MED ORDER — TRANEXAMIC ACID-NACL 1000-0.7 MG/100ML-% IV SOLN
INTRAVENOUS | Status: DC | PRN
  Administered 2019-10-04: 1000 mg via INTRAVENOUS

## 2019-10-04 MED ORDER — BUPIVACAINE HCL (PF) 0.25 % IJ SOLN
INTRAMUSCULAR | Status: AC
  Filled 2019-10-04: qty 30

## 2019-10-04 MED ORDER — MORPHINE SULFATE (PF) 4 MG/ML IV SOLN
INTRAVENOUS | Status: AC
  Filled 2019-10-04: qty 2

## 2019-10-04 MED ORDER — POVIDONE-IODINE 10 % EX SWAB: 2.0000 "application " | Freq: Once | CUTANEOUS | Status: DC

## 2019-10-04 MED ORDER — LIDOCAINE 2% (20 MG/ML) 5 ML SYRINGE
INTRAMUSCULAR | Status: DC | PRN
  Administered 2019-10-04: 100 mg via INTRAVENOUS

## 2019-10-04 MED ORDER — CHLORHEXIDINE GLUCONATE 4 % EX LIQD: 60.0000 mL | Freq: Once | CUTANEOUS | Status: DC

## 2019-10-04 MED ORDER — "VISTASEAL 4 ML SINGLE DOSE KIT "
PACK | CUTANEOUS | Status: DC | PRN
  Administered 2019-10-04: 4 mL via TOPICAL

## 2019-10-04 MED ORDER — MEPERIDINE HCL 25 MG/ML IJ SOLN: 6.2500 mg | INTRAMUSCULAR | Status: DC | PRN

## 2019-10-04 MED ORDER — PROPOFOL 10 MG/ML IV BOLUS
INTRAVENOUS | Status: DC | PRN
  Administered 2019-10-04: 250 mg via INTRAVENOUS
  Administered 2019-10-04: 25 ug/kg/min via INTRAVENOUS

## 2019-10-04 MED ORDER — CEFAZOLIN SODIUM-DEXTROSE 2-4 GM/100ML-% IV SOLN
2.0000 g | INTRAVENOUS | Status: AC
  Administered 2019-10-04: 13:00:00 2 g via INTRAVENOUS

## 2019-10-04 MED ORDER — METHOCARBAMOL 500 MG PO TABS: 500.0000 mg | ORAL_TABLET | Freq: Three times a day (TID) | ORAL | 0 refills | Status: DC | PRN

## 2019-10-04 MED ORDER — CEFAZOLIN SODIUM-DEXTROSE 2-4 GM/100ML-% IV SOLN
INTRAVENOUS | Status: AC
  Filled 2019-10-04: qty 100

## 2019-10-04 MED ORDER — CLONIDINE HCL (ANALGESIA) 100 MCG/ML EP SOLN
EPIDURAL | Status: DC | PRN
  Administered 2019-10-04: 100 ug

## 2019-10-04 MED ORDER — DEXAMETHASONE SODIUM PHOSPHATE 10 MG/ML IJ SOLN
INTRAMUSCULAR | Status: DC | PRN
  Administered 2019-10-04 (×2): 10 mg

## 2019-10-04 MED ORDER — DEXAMETHASONE SODIUM PHOSPHATE 10 MG/ML IJ SOLN
INTRAMUSCULAR | Status: AC
  Filled 2019-10-04: qty 1

## 2019-10-04 MED ORDER — ONDANSETRON HCL 4 MG/2ML IJ SOLN
INTRAMUSCULAR | Status: DC | PRN
  Administered 2019-10-04 (×2): 4 mg via INTRAVENOUS

## 2019-10-04 MED ORDER — TRANEXAMIC ACID-NACL 1000-0.7 MG/100ML-% IV SOLN
INTRAVENOUS | Status: AC
  Filled 2019-10-04: qty 100

## 2019-10-04 MED ORDER — OXYCODONE HCL 5 MG/5ML PO SOLN: 5.0000 mg | Freq: Once | ORAL | Status: DC | PRN

## 2019-10-04 MED ORDER — GELATIN ABSORBABLE 12-7 MM EX MISC
CUTANEOUS | Status: DC | PRN
  Administered 2019-10-04: 1 via TOPICAL

## 2019-10-04 MED ORDER — CELECOXIB 100 MG PO CAPS: 100.0000 mg | ORAL_CAPSULE | Freq: Two times a day (BID) | ORAL | 2 refills | Status: AC | PRN

## 2019-10-04 MED ORDER — MIDAZOLAM HCL 2 MG/2ML IJ SOLN
1.0000 mg | INTRAMUSCULAR | Status: DC | PRN
  Administered 2019-10-04: 2 mg via INTRAVENOUS

## 2019-10-04 MED ORDER — ASPIRIN EC 81 MG PO TBEC: 81.0000 mg | DELAYED_RELEASE_TABLET | Freq: Every day | ORAL | 1 refills | Status: DC

## 2019-10-04 MED ORDER — PROPOFOL 10 MG/ML IV BOLUS
INTRAVENOUS | Status: AC
  Filled 2019-10-04: qty 40

## 2019-10-04 MED ORDER — DEXMEDETOMIDINE HCL 200 MCG/2ML IV SOLN
INTRAVENOUS | Status: DC | PRN
  Administered 2019-10-04 (×6): 8 ug via INTRAVENOUS

## 2019-10-04 MED ORDER — MORPHINE SULFATE (PF) 4 MG/ML IV SOLN
INTRAVENOUS | Status: DC | PRN
  Administered 2019-10-04: 8 mg

## 2019-10-04 MED ORDER — ONDANSETRON HCL 4 MG/2ML IJ SOLN: 4.0000 mg | Freq: Once | INTRAMUSCULAR | Status: DC | PRN

## 2019-10-04 MED ORDER — CLONIDINE HCL (ANALGESIA) 100 MCG/ML EP SOLN
EPIDURAL | Status: AC
  Filled 2019-10-04: qty 10

## 2019-10-04 MED ORDER — FENTANYL CITRATE (PF) 100 MCG/2ML IJ SOLN
50.0000 ug | INTRAMUSCULAR | Status: AC | PRN
  Administered 2019-10-04: 100 ug via INTRAVENOUS
  Administered 2019-10-04: 50 ug via INTRAVENOUS
  Administered 2019-10-04 (×3): 25 ug via INTRAVENOUS
  Administered 2019-10-04 (×2): 50 ug via INTRAVENOUS
  Administered 2019-10-04 (×3): 25 ug via INTRAVENOUS

## 2019-10-04 MED ORDER — ROPIVACAINE HCL 7.5 MG/ML IJ SOLN
INTRAMUSCULAR | Status: DC | PRN
  Administered 2019-10-04: 30 mL via PERINEURAL

## 2019-10-04 MED ORDER — PROPOFOL 10 MG/ML IV BOLUS
INTRAVENOUS | Status: AC
  Filled 2019-10-04: qty 20

## 2019-10-04 MED ORDER — OXYCODONE HCL 5 MG PO TABS: 5.0000 mg | ORAL_TABLET | ORAL | 0 refills | Status: AC | PRN

## 2019-10-04 MED ORDER — ONDANSETRON HCL 4 MG/2ML IJ SOLN
INTRAMUSCULAR | Status: AC
  Filled 2019-10-04: qty 4

## 2019-10-04 MED ORDER — OXYCODONE HCL 5 MG PO TABS: 5.0000 mg | ORAL_TABLET | Freq: Once | ORAL | Status: DC | PRN

## 2019-10-04 MED ORDER — FENTANYL CITRATE (PF) 100 MCG/2ML IJ SOLN: 25.0000 ug | INTRAMUSCULAR | Status: DC | PRN

## 2019-10-04 MED ORDER — ACETAMINOPHEN 325 MG PO TABS: 325.0000 mg | ORAL_TABLET | ORAL | Status: DC | PRN

## 2019-10-04 MED ORDER — BUPIVACAINE HCL (PF) 0.25 % IJ SOLN
INTRAMUSCULAR | Status: DC | PRN
  Administered 2019-10-04: 30 mL

## 2019-10-04 MED ORDER — LACTATED RINGERS IV SOLN: INTRAVENOUS | Status: DC

## 2019-10-04 SURGICAL SUPPLY — 123 items
APPLICATOR COTTON TIP 6 STRL (MISCELLANEOUS) ×1 IMPLANT
APPLICATOR COTTON TIP 6IN STRL (MISCELLANEOUS) ×3
BANDAGE ESMARK 6X9 LF (GAUZE/BANDAGES/DRESSINGS) ×1 IMPLANT
BLADE AVERAGE 25MMX9MM (BLADE)
BLADE AVERAGE 25X9 (BLADE) IMPLANT
BLADE EXCALIBUR 4.0MM X 13CM (MISCELLANEOUS)
BLADE EXCALIBUR 4.0X13 (MISCELLANEOUS) IMPLANT
BLADE SURG 10 STRL SS (BLADE) ×6 IMPLANT
BLADE SURG 15 STRL LF DISP TIS (BLADE) ×5 IMPLANT
BLADE SURG 15 STRL SS (BLADE) ×10
BNDG ELASTIC 4X5.8 VLCR STR LF (GAUZE/BANDAGES/DRESSINGS) IMPLANT
BNDG ELASTIC 6X5.8 VLCR STR LF (GAUZE/BANDAGES/DRESSINGS) ×3 IMPLANT
BNDG ESMARK 6X9 LF (GAUZE/BANDAGES/DRESSINGS) ×3
CLOSURE WOUND 1/2 X4 (GAUZE/BANDAGES/DRESSINGS) ×1
COVER BACK TABLE 60X90IN (DRAPES) ×3 IMPLANT
COVER MAYO STAND STRL (DRAPES) IMPLANT
COVER SURGICAL LIGHT HANDLE (MISCELLANEOUS) ×3 IMPLANT
COVER WAND RF STERILE (DRAPES) ×3 IMPLANT
CUFF TOURN SGL QUICK 34 (TOURNIQUET CUFF) ×2
CUFF TOURN SGL QUICK 42 (TOURNIQUET CUFF) IMPLANT
CUFF TRNQT CYL 34X4.125X (TOURNIQUET CUFF) ×1 IMPLANT
DECANTER SPIKE VIAL GLASS SM (MISCELLANEOUS) IMPLANT
DISSECTOR  3.8MM X 13CM (MISCELLANEOUS)
DISSECTOR 3.8MM X 13CM (MISCELLANEOUS) IMPLANT
DRAPE C-ARM 42X72 X-RAY (DRAPES) ×3 IMPLANT
DRAPE EXTREMITY T 121X128X90 (DISPOSABLE) IMPLANT
DRAPE IMP U-DRAPE 54X76 (DRAPES) ×3 IMPLANT
DRAPE INCISE IOBAN 66X45 STRL (DRAPES) ×6 IMPLANT
DRAPE LAPAROTOMY T 98X78 PEDS (DRAPES) IMPLANT
DRAPE ORTHO SPLIT 77X108 STRL (DRAPES)
DRAPE SURG ORHT 6 SPLT 77X108 (DRAPES) IMPLANT
DRAPE U-SHAPE 47X51 STRL (DRAPES) ×6 IMPLANT
DRSG AQUACEL AG ADV 3.5X 6 (GAUZE/BANDAGES/DRESSINGS) ×3 IMPLANT
DRSG AQUACEL AG ADV 3.5X10 (GAUZE/BANDAGES/DRESSINGS) ×3 IMPLANT
DRSG PAD ABDOMINAL 8X10 ST (GAUZE/BANDAGES/DRESSINGS) ×6 IMPLANT
DRSG TEGADERM 2-3/8X2-3/4 SM (GAUZE/BANDAGES/DRESSINGS) IMPLANT
DRSG TEGADERM 4X4.75 (GAUZE/BANDAGES/DRESSINGS) IMPLANT
DURAPREP 26ML APPLICATOR (WOUND CARE) ×6 IMPLANT
DW OUTFLOW CASSETTE/TUBE SET (MISCELLANEOUS) IMPLANT
ELECT REM PT RETURN 9FT ADLT (ELECTROSURGICAL) ×3
ELECTRODE REM PT RTRN 9FT ADLT (ELECTROSURGICAL) ×1 IMPLANT
EVACUATOR 1/8 PVC DRAIN (DRAIN) IMPLANT
FIBERLOOP 2 0 (SUTURE) ×6 IMPLANT
GAUZE 4X4 16PLY RFD (DISPOSABLE) IMPLANT
GAUZE SPONGE 4X4 12PLY STRL (GAUZE/BANDAGES/DRESSINGS) IMPLANT
GAUZE XEROFORM 1X8 LF (GAUZE/BANDAGES/DRESSINGS) IMPLANT
GLOVE BIO SURGEON STRL SZ7 (GLOVE) ×3 IMPLANT
GLOVE BIOGEL PI IND STRL 7.0 (GLOVE) ×2 IMPLANT
GLOVE BIOGEL PI IND STRL 8 (GLOVE) ×2 IMPLANT
GLOVE BIOGEL PI IND STRL 8.5 (GLOVE) ×1 IMPLANT
GLOVE BIOGEL PI INDICATOR 7.0 (GLOVE) ×4
GLOVE BIOGEL PI INDICATOR 8 (GLOVE) ×4
GLOVE BIOGEL PI INDICATOR 8.5 (GLOVE) ×2
GLOVE ECLIPSE 8.0 STRL XLNG CF (GLOVE) ×6 IMPLANT
GLOVE SURG ORTHO 8.0 STRL STRW (GLOVE) ×3 IMPLANT
GLOVE SURG SS PI 8.0 STRL IVOR (GLOVE) ×3 IMPLANT
GOWN STRL REUS W/ TWL LRG LVL3 (GOWN DISPOSABLE) ×1 IMPLANT
GOWN STRL REUS W/ TWL XL LVL3 (GOWN DISPOSABLE) ×3 IMPLANT
GOWN STRL REUS W/TWL LRG LVL3 (GOWN DISPOSABLE) ×2
GOWN STRL REUS W/TWL XL LVL3 (GOWN DISPOSABLE) ×6
KIT BIO-TENODESIS 3X8 DISP (MISCELLANEOUS)
KIT INSRT BABSR STRL DISP BTN (MISCELLANEOUS) IMPLANT
KIT TURNOVER KIT B (KITS) IMPLANT
KNEE WRAP E Z 3 GEL PACK (MISCELLANEOUS) IMPLANT
MACI AUTOLOGOUS CELL SCAFFOLD (Tissue) ×3 IMPLANT
MANIFOLD NEPTUNE II (INSTRUMENTS) ×3 IMPLANT
NDL SAFETY ECLIPSE 18X1.5 (NEEDLE) ×2 IMPLANT
NEEDLE HYPO 18GX1.5 BLUNT FILL (NEEDLE) ×3 IMPLANT
NEEDLE HYPO 18GX1.5 SHARP (NEEDLE) ×4
NS IRRIG 1000ML POUR BTL (IV SOLUTION) ×3 IMPLANT
PACK ARTHROSCOPY DSU (CUSTOM PROCEDURE TRAY) ×6 IMPLANT
PACK BASIN DAY SURGERY FS (CUSTOM PROCEDURE TRAY) ×3 IMPLANT
PACK IMPLANT BIOCOMPOSITE MPFL (Orthopedic Implant) ×3 IMPLANT
PACK UNIVERSAL I (CUSTOM PROCEDURE TRAY) ×3 IMPLANT
PAD ARMBOARD 7.5X6 YLW CONV (MISCELLANEOUS) ×6 IMPLANT
PAD CAST 4YDX4 CTTN HI CHSV (CAST SUPPLIES) ×1 IMPLANT
PADDING CAST COTTON 4X4 STRL (CAST SUPPLIES) ×2
PADDING CAST COTTON 6X4 STRL (CAST SUPPLIES) ×3 IMPLANT
PASSER SUT SWANSON 36MM LOOP (INSTRUMENTS) ×3 IMPLANT
PENCIL SMOKE EVACUATOR (MISCELLANEOUS) ×3 IMPLANT
PORT APPOLLO RF 90DEGREE MULTI (SURGICAL WAND) IMPLANT
SCAFFOLD CELL AUTOLOGOUS MACI (Tissue) ×1 IMPLANT
SHEET MEDIUM DRAPE 40X70 STRL (DRAPES) IMPLANT
SPONGE LAP 18X18 RF (DISPOSABLE) ×3 IMPLANT
SPONGE LAP 4X18 RFD (DISPOSABLE) ×6 IMPLANT
SPONGE SURGIFOAM ABS GEL 100 (HEMOSTASIS) ×3 IMPLANT
STRIP CLOSURE SKIN 1/2X4 (GAUZE/BANDAGES/DRESSINGS) ×2 IMPLANT
SUCTION FRAZIER HANDLE 10FR (MISCELLANEOUS) ×2
SUCTION TUBE FRAZIER 10FR DISP (MISCELLANEOUS) ×1 IMPLANT
SUT 2 FIBERLOOP 20 STRT BLUE (SUTURE)
SUT ETHILON 3 0 PS 1 (SUTURE) IMPLANT
SUT FIBERWIRE #2 38 T-5 BLUE (SUTURE)
SUT FIBERWIRE 2-0 18 17.9 3/8 (SUTURE) ×3
SUT MNCRL AB 3-0 PS2 27 (SUTURE) ×6 IMPLANT
SUT MNCRL AB 4-0 PS2 18 (SUTURE) ×6 IMPLANT
SUT PROLENE 3 0 PS 2 (SUTURE) ×3 IMPLANT
SUT VIC AB 0 CT1 27 (SUTURE) ×8
SUT VIC AB 0 CT1 27XBRD ANBCTR (SUTURE) ×4 IMPLANT
SUT VIC AB 0 CT1 27XCR 8 STRN (SUTURE) IMPLANT
SUT VIC AB 1 CT1 27 (SUTURE) ×12
SUT VIC AB 1 CT1 27XBRD ANBCTR (SUTURE) ×6 IMPLANT
SUT VIC AB 2-0 CT1 27 (SUTURE) ×2
SUT VIC AB 2-0 CT1 TAPERPNT 27 (SUTURE) ×1 IMPLANT
SUT VIC AB 2-0 SH 27 (SUTURE)
SUT VIC AB 2-0 SH 27XBRD (SUTURE) IMPLANT
SUT VIC AB 3-0 FS2 27 (SUTURE) IMPLANT
SUT VICRYL 0 UR6 27IN ABS (SUTURE) IMPLANT
SUTURE 2 FIBERLOOP 20 STRT BLU (SUTURE) IMPLANT
SUTURE FIBERWR #2 38 T-5 BLUE (SUTURE) IMPLANT
SUTURE FIBERWR 2-0 18 17.9 3/8 (SUTURE) ×1 IMPLANT
SYR 5ML LL (SYRINGE) ×3 IMPLANT
SYR BULB IRRIGATION 50ML (SYRINGE) ×3 IMPLANT
SYR CONTROL 10ML LL (SYRINGE) ×3 IMPLANT
SYR TB 1ML 25GX5/8 (SYRINGE) IMPLANT
SYR TB 1ML 27GX1/2 SAFE (SYRINGE) IMPLANT
SYR TB 1ML 27GX1/2 SAFETY (SYRINGE)
SYR TB 1ML LL NO SAFETY (SYRINGE) ×3 IMPLANT
TENDON SEMI-TENDINOSUS (Bone Implant) ×3 IMPLANT
TOWEL GREEN STERILE FF (TOWEL DISPOSABLE) ×9 IMPLANT
TRAY DSU PREP LF (CUSTOM PROCEDURE TRAY) ×3 IMPLANT
TUBING ARTHROSCOPY IRRIG 16FT (MISCELLANEOUS) ×3 IMPLANT
WATER STERILE IRR 1000ML POUR (IV SOLUTION) IMPLANT
YANKAUER SUCT BULB TIP NO VENT (SUCTIONS) ×3 IMPLANT

## 2019-10-04 NOTE — H&P (Signed)
Mary [name] is an 19 y.o. female.   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  She had significant chondral injury on the undersurface of her patella.  She presents now for operative management and matrix autologous chondrocyte implantation for the chondral defect along with medial patellofemoral ligament reconstruction.  Patient has had episodes of prior patellar instability but none so severe.  No personal or family history of DVT or pulmonary embolism.  She plays basketball at a high level.  Past Medical History:  Diagnosis Date  . Patellar dislocation, right, subsequent encounter   . Precocious puberty     Past Surgical History:  Procedure Laterality Date  . KNEE ARTHROSCOPY Right 08/14/2019   Procedure: right knee arthrosopy, removal of loose bodies;  Surgeon: Cammy Copa, MD;  Location: [facility];  Service: Orthopedics;  Laterality: Right;  . SUPPRELIN IMPLANT    . SUPPRELIN REMOVAL      History reviewed. No pertinent family history. Social History:  reports that she has never smoked. She has never used smokeless tobacco. She reports that she does not drink alcohol or use drugs.  Allergies: No Known Allergies  No medications prior to admission.    Results for orders placed or performed during the hospital encounter of 10/04/19 (from the past 48 hour(s))  Pregnancy, urine POC     Status: None   Collection Time: 10/04/19 10:36 AM  Result Value Ref Range   Preg Test, Ur NEGATIVE NEGATIVE    Comment:        THE SENSITIVITY OF THIS METHODOLOGY IS >24 mIU/mL    No results found.  Review of Systems  Musculoskeletal: Positive for arthralgias.  All other systems reviewed and are negative.   Blood pressure 139/80, pulse 74, temperature (!) 97.3 F (36.3 C), temperature source Temporal, resp. rate 20, height 6' (1.829  m), weight 113.4 kg, last menstrual period 09/15/2019, SpO2 100 %. Physical Exam  Constitutional: She appears well-developed.  HENT:  Head: Normocephalic.  Eyes: Pupils are equal, round, and reactive to light.  Cardiovascular: Normal rate.  Respiratory: Effort normal.  Musculoskeletal:     Cervical back: Normal range of motion.  Neurological: She is alert.  Skin: Skin is warm.  Psychiatric: She has a normal mood and affect.  Examination of the right knee demonstrates mild patellar apprehension with good range of motion.  Collateral and cruciate ligaments are stable.  Extensor mechanism is intact.  Patient has mild patellofemoral crepitus.  Pedal pulses palpable.  Calf is soft.  Assessment/Plan Impression is chondral defect undersurface of the patella.  Plan is matrix autologous chondrocyte implantation.  She will also undergo medial patellofemoral ligament reconstruction.  Risk and benefits are discussed include not limited to knee stiffness patellofemoral arthritis infection as well as potential for diminished quad strength and jumping ability.  Patient understands risk benefits and wishes to proceed.  All questions answered.  Burnard Bunting, MD 10/04/2019, 12:04 PM

## 2019-10-04 NOTE — Op Note (Signed)
NAMEANAYELI, AREL MEDICAL RECORD OY:[id number] ACCOUNT [id number] DATE OF BIRTH:[birth year] FACILITY: [facility] LOCATION: [facility] PHYSICIAN:Demetrias Goodbar Randel Pigg, MD  OPERATIVE REPORT  DATE OF PROCEDURE:  10/04/2019  PREOPERATIVE DIAGNOSIS:  Right knee patellofemoral instability with chondral defect on the undersurface of the patella.  POSTOPERATIVE DIAGNOSIS:  Right knee patellofemoral instability with chondral defect on the undersurface of the patella.  PROCEDURE:  Right knee open medial patellofemoral ligament reconstruction with matrix autologous chondrocyte implantation for chondral defect.  SURGEON:  Meredith Pel, MD  ASSISTANT:  Annie Main, PA.  INDICATIONS:  The patient is a 19 year old patient who is approximately 3 months out from patellar dislocation.  She had large loose chondral fragments from the patella removed approximately 6 weeks ago.  Presents now for operative management of patellar  instability with large chondral defect within the patella.  PROCEDURE IN DETAIL:  The patient was brought to the operating room where general anesthetic was induced.  Preoperative antibiotics administered.  Timeout was called.  Right leg was examined under anesthesia.  The patient had pretty significant patellar  instability with about 3 cm plus lateral mobility of the patella with the knee in about 20 degrees of flexion.  Collateral and cruciate ligaments were stable.  At this time, the right leg was prescrubbed with alcohol and Betadine, allowed to air dry,  prepped with DuraPrep solution and draped in sterile manner.  Time-out was called.  Incision was made over the patella itself medial aspect, extending about 10 cm total.  Skin and subcutaneous tissue were sharply divided.  In an extraarticular fashion,  the medial aspect of the patella was identified.  Two drill holes were placed in the upper half of the patella for medial patellofemoral ligament graft placement.  Allograft was  utilized, prepared on the back table by [employer].  Concurrently, under  fluoroscopic guidance, the MPFL attachment site on the femur was identified just proximal and slightly posterior to the superficial MCL medial epicondyle and slightly distal to the adductor tubercle.  At this point, it was confirmed both manually as well  as fluoroscopically.  Guide pin was placed.  Good isometry at that point was identified.  Correct graft length was then obtained.  The graft was then placed using Arthrex SwiveLocks, 4.5 mm with good secure fixation achieved.  At this time, the femoral  tunnel was drilled as well.  Next, the arthrotomy was made.  Patella was everted.  Chondral defect was identified.  It was a large chondral defect.  In accordance with MACI protocol, the oval cutter which best encompassed the lesion as well as loose  chondral flaps around the lesion was chosen.  This was then tapped into position to make a clean cut on the vertical surface of the chondral sites.  Next, this area was denuded and debrided.  This was taken down to the level of the calcified chondral  cartilage, which was also removed with a curette.  On the back table, the graft was prepared.  Thrombin-soaked sponges were then placed x2 episodes into the base for hemostasis.  The tourniquet was released at this time.  Total tourniquet time about 80  minutes.  Thorough irrigation of the joint was performed.  Next, the condyle were inspected and no other lesions were seen particularly on that lateral femoral condyle.  Next, the graft was placed into the prepared bed and sealed with fibrin glue, which  was placed on the bottom of the defect and then around the edges to seal it.  Knee was taken through a range of motion after 3 minutes each of sealant time.  The graft was stable.  Next, the knee was flexed to about 20 degrees.  Then the patella was  shifted about 2 cm lateral and the graft was tightened in that position.  This gave very  good stability as well as isometry.  The knee was taken through a range of motion and the patella looked appropriately restrained, but not over-constrained.  The  thorough irrigation was then performed.  Arthrotomy was closed using #1 Vicryl suture, followed by interrupted inverted 0 Vicryl suture, 2-0 Vicryl suture and a 3-0 Monocryl.  The incision for the medial aspect of the graft was also closed using 0 Vicryl  suture, 2-0 Vicryl suture and 3-0 Monocryl.  Marcaine, morphine, clonidine was injected into each of the incisions for postop pain relief.  Steri-Strips, Aquacel dressings placed, along with knee immobilizer locked in extension.  The patient tolerated  the procedure well without immediate complications, transferred to the recovery room in stable condition.  [name]'s assistance was required at all times for graft preparation, opening, closing, retraction.  His assistant was a medical necessity.  VN/NUANCE  D:10/04/2019 T:10/04/2019 JOB:[id number]

## 2019-10-04 NOTE — Anesthesia Postprocedure Evaluation (Signed)
Anesthesia Post Note  Patient: Mary [name]  Procedure(s) Performed: Right Knee Open Medial Patellar Femoral Ligament  Reconstruction (Right Knee) Osteochondral  Defect Repair/Reconstruction With MACI (Matrix AutologousChondrocyte Implantation) (Right Knee)     Patient location during evaluation: PACU Anesthesia Type: General Level of consciousness: awake and alert Pain management: pain level controlled Vital Signs Assessment: post-procedure vital signs reviewed and stable Respiratory status: spontaneous breathing, nonlabored ventilation, respiratory function stable and patient connected to nasal cannula oxygen Cardiovascular status: blood pressure returned to baseline and stable Postop Assessment: no apparent nausea or vomiting Anesthetic complications: no    Last Vitals:  Vitals:   10/04/19 1645 10/04/19 1700  BP: 123/80 137/85  Pulse: 77 79  Resp: 13 13  Temp:    SpO2: 100% 100%    Last Pain:  Vitals:   10/04/19 1700  TempSrc:   PainSc: 0-No pain        RLE Motor Response: Purposeful movement (10/04/19 1700) RLE Sensation: Full sensation (10/04/19 1700)      Meerab Maselli

## 2019-10-04 NOTE — Progress Notes (Signed)
AssistedDr. Oddono with right, ultrasound guided, adductor canal block. Side rails up, monitors on throughout procedure. See vital signs in flow sheet. Tolerated Procedure well.  

## 2019-10-04 NOTE — Brief Op Note (Signed)
   10/04/2019  3:53 PM  PATIENT:  Carolyne Littles  19 y.o. female  PRE-OPERATIVE DIAGNOSIS:  knee chondral defect patella with patellar instability  POST-OPERATIVE DIAGNOSIS: Same  PROCEDURE:  Procedure(s): Right Knee Open Medial Patellar Femoral Ligament  Reconstruction Osteochondral  Defect Repair/Reconstruction With MACI (Matrix AutologousChondrocyte Implantation)  SURGEON:  Surgeon(s): Cammy Copa, MD  ASSISTANT: Karenann Cai, PA  ANESTHESIA:   general  EBL: 25 ml    Total I/O In: 200 [IV Piggyback:200] Out: 50 [Blood:50]  BLOOD ADMINISTERED: none  DRAINS: none   LOCAL MEDICATIONS USED: Marcaine morphine clonidine  SPECIMEN:  No Specimen  COUNTS:  YES  TOURNIQUET:   Total Tourniquet Time Documented: Thigh (Right) - 80 minutes Total: Thigh (Right) - 80 minutes   DICTATION: .Other Dictation: Dictation Number [id number]  PLAN OF CARE: Discharge to home after PACU  PATIENT DISPOSITION:  PACU - hemodynamically stable

## 2019-10-04 NOTE — Anesthesia Procedure Notes (Signed)
Procedure Name: LMA Insertion Date/Time: 10/04/2019 12:31 PM Performed by: Marny Lowenstein, CRNA Pre-anesthesia Checklist: Patient identified, Emergency Drugs available, Suction available and Patient being monitored Patient Re-evaluated:Patient Re-evaluated prior to induction Oxygen Delivery Method: Circle system utilized Preoxygenation: Pre-oxygenation with 100% oxygen Induction Type: IV induction Ventilation: Mask ventilation without difficulty LMA: LMA inserted LMA Size: 4.0 Number of attempts: 1 Airway Equipment and Method: Patient positioned with wedge pillow Placement Confirmation: positive ETCO2 and breath sounds checked- equal and bilateral Tube secured with: Tape Dental Injury: Teeth and Oropharynx as per pre-operative assessment

## 2019-10-04 NOTE — Anesthesia Preprocedure Evaluation (Signed)
Anesthesia Evaluation  Patient identified by MRN, date of birth, ID band Patient awake    Reviewed: Allergy & Precautions, NPO status , Patient's Chart, lab work & pertinent test results  Airway Mallampati: II  TM Distance: >3 FB Neck ROM: Full    Dental no notable dental hx.    Pulmonary neg pulmonary ROS,    Pulmonary exam normal breath sounds clear to auscultation       Cardiovascular negative cardio ROS Normal cardiovascular exam Rhythm:Regular Rate:Normal     Neuro/Psych negative neurological ROS  negative psych ROS   GI/Hepatic negative GI ROS, Neg liver ROS,   Endo/Other  negative endocrine ROS  Renal/GU negative Renal ROS  negative genitourinary   Musculoskeletal negative musculoskeletal ROS (+)   Abdominal   Peds negative pediatric ROS (+)  Hematology negative hematology ROS (+)   Anesthesia Other Findings   Reproductive/Obstetrics negative OB ROS                             Anesthesia Physical  Anesthesia Plan  ASA: I  Anesthesia Plan: General   Post-op Pain Management: GA combined w/ Regional for post-op pain   Induction: Intravenous  PONV Risk Score and Plan: 3 and Ondansetron, Dexamethasone, Midazolam and Treatment may vary due to age or medical condition  Airway Management Planned: LMA  Additional Equipment:   Intra-op Plan:   Post-operative Plan: Extubation in OR  Informed Consent: I have reviewed the patients History and Physical, chart, labs and discussed the procedure including the risks, benefits and alternatives for the proposed anesthesia with the patient or authorized representative who has indicated his/her understanding and acceptance.     Dental advisory given  Plan Discussed with: CRNA and Anesthesiologist  Anesthesia Plan Comments: (Discussed both nerve block for pain relief post-op and GA; including NV, sore throat, dental injury, and  pulmonary complications)        Anesthesia Quick Evaluation

## 2019-10-04 NOTE — Transfer of Care (Signed)
Immediate Anesthesia Transfer of Care Note  Patient: Mary [name]  Procedure(s) Performed: Right Knee Open Medial Patellar Femoral Ligament  Reconstruction (Right Knee) Osteochondral  Defect Repair/Reconstruction With MACI (Matrix AutologousChondrocyte Implantation) (Right Knee)  Patient Location: PACU  Anesthesia Type:General and Regional  Level of Consciousness: drowsy  Airway & Oxygen Therapy: Patient Spontanous Breathing and Patient connected to face mask oxygen  Post-op Assessment: Report given to RN and Post -op Vital signs reviewed and stable  Post vital signs: Reviewed and stable  Last Vitals:  Vitals Value Taken Time  BP 134/66 10/04/19 1625  Temp    Pulse 80 10/04/19 1630  Resp 14 10/04/19 1630  SpO2 100 % 10/04/19 1630  Vitals shown include unvalidated device data.  Last Pain:  Vitals:   10/04/19 1048  TempSrc: Temporal  PainSc: 0-No pain      Patients Stated Pain Goal: 2 (10/04/19 1048)  Complications: No apparent anesthesia complications

## 2019-10-04 NOTE — Anesthesia Procedure Notes (Signed)
Anesthesia Regional Block: Adductor canal block   Pre-Anesthetic Checklist: ,, timeout performed, Correct Patient, Correct Site, Correct Laterality, Correct Procedure, Correct Position, site marked, Risks and benefits discussed,  Surgical consent,  Pre-op evaluation,  At surgeon's request and post-op pain management  Laterality: Right  Prep: chloraprep       Needles:  Injection technique: Single-shot  Needle Type: Echogenic Stimulator Needle     Needle Length: 5cm  Needle Gauge: 22     Additional Needles:   Procedures:, nerve stimulator,,, ultrasound used (permanent image in chart),,,,   Nerve Stimulator or Paresthesia:  Response: quadraceps contraction, 0.45 mA,   Additional Responses:   Narrative:  Start time: 10/04/2019 12:07 PM End time: 10/04/2019 12:10 PM Injection made incrementally with aspirations every 5 mL.  Performed by: Personally  Anesthesiologist: Bethena Midget, MD  Additional Notes: Functioning IV was confirmed and monitors were applied.  A 96mm 22ga Arrow echogenic stimulator needle was used. Sterile prep and drape,hand hygiene and sterile gloves were used. Ultrasound guidance: relevant anatomy identified, needle position confirmed, local anesthetic spread visualized around nerve(s)., vascular puncture avoided.  Image printed for medical record. Negative aspiration and negative test dose prior to incremental administration of local anesthetic. The patient tolerated the procedure well.

## 2019-10-04 NOTE — Discharge Instructions (Signed)

## 2019-10-04 NOTE — Progress Notes (Signed)
Orthopedic Tech Progress Note Patient Details:  Mary [name] [birth year] [id number] Called in order to [facility] for a STAT HINGED KNEE BRACE. Patient ID: Mary [name], female   DOB: 09/07/2000, 19 y.o.   MRN: [id number]   Mary [name] 10/04/2019, 3:19 PM

## 2019-10-09 ENCOUNTER — Encounter: Payer: Self-pay | Admitting: *Deleted

## 2019-10-09 ENCOUNTER — Inpatient Hospital Stay: Payer: 59 | Admitting: Orthopedic Surgery

## 2019-10-11 ENCOUNTER — Encounter: Payer: Self-pay | Admitting: Orthopedic Surgery

## 2019-10-11 ENCOUNTER — Other Ambulatory Visit: Payer: Self-pay

## 2019-10-11 ENCOUNTER — Ambulatory Visit (INDEPENDENT_AMBULATORY_CARE_PROVIDER_SITE_OTHER): Payer: PRIVATE HEALTH INSURANCE | Admitting: Orthopedic Surgery

## 2019-10-11 VITALS — Ht 72.0 in | Wt 249.0 lb

## 2019-10-11 DIAGNOSIS — S83004D Unspecified dislocation of right patella, subsequent encounter: Secondary | ICD-10-CM

## 2019-10-13 ENCOUNTER — Encounter: Payer: Self-pay | Admitting: Orthopedic Surgery

## 2019-10-13 NOTE — Progress Notes (Signed)
   Post-Op Visit Note   Patient: Mary [name]           Date of Birth: [birth year]           MRN: [id number] Visit Date: 10/11/2019 PCP: Irena Reichmann, DO   Assessment & Plan:  Chief Complaint:  Chief Complaint  Patient presents with  . Right Knee - Routine Post Op    Open medial patella femoral ligament repair DOS 10/04/2019   Visit Diagnoses:  1. Patellar dislocation, right, subsequent encounter     Plan: Patient presents now a week out right knee MACI procedure as well as patellofemoral ligament reconstruction with allograft.  On exam no calf tenderness.  Negative Homans.  Mild effusion is present.  Patella lateral mobility is good about 2 cm with good endpoint.  She is on the CPM machine 0-20.  Like to increase that 0-60 over the next 3 weeks.  Okay for straight leg raises.  Continue with 30% weightbearing per [facility] protocol.  Follow-up in 3 weeks in training room for repeat clinical check.  Continue with aspirin until she advances her weightbearing.  Follow-Up Instructions: Return in about 3 weeks (around 11/01/2019).   Orders:  No orders of the defined types were placed in this encounter.  No orders of the defined types were placed in this encounter.   Imaging: No results found.  PMFS History: There are no problems to display for this patient.  Past Medical History:  Diagnosis Date  . Patellar dislocation, right, subsequent encounter   . Precocious puberty     History reviewed. No pertinent family history.  Past Surgical History:  Procedure Laterality Date  . KNEE ARTHROSCOPY Right 08/14/2019   Procedure: right knee arthrosopy, removal of loose bodies;  Surgeon: Cammy Copa, MD;  Location: [facility];  Service: Orthopedics;  Laterality: Right;  . MEDIAL PATELLOFEMORAL LIGAMENT REPAIR Right 10/04/2019   Procedure: Right Knee Open Medial Patellar Femoral Ligament  Reconstruction;  Surgeon: Cammy Copa, MD;  Location: [facility];  Service: Orthopedics;  Laterality: Right;  . OSTEOCHONDRAL DEFECT REPAIR/RECONSTRUCTION Right 10/04/2019   Procedure: Osteochondral  Defect Repair/Reconstruction With MACI [facility] AutologousChondrocyte Implantation);  Surgeon: Cammy Copa, MD;  Location: [facility];  Service: Orthopedics;  Laterality: Right;  . SUPPRELIN IMPLANT    . SUPPRELIN REMOVAL     Social History   Occupational History  . Not on file  Tobacco Use  . Smoking status: Never Smoker  . Smokeless tobacco: Never Used  Substance and Sexual Activity  . Alcohol use: Never  . Drug use: Never  . Sexual activity: Not on file

## 2019-10-26 ENCOUNTER — Other Ambulatory Visit: Payer: Self-pay | Admitting: Surgical

## 2019-10-26 NOTE — Telephone Encounter (Signed)
Dean pt 

## 2019-10-26 NOTE — Telephone Encounter (Signed)
Ok to rf? 

## 2019-11-09 ENCOUNTER — Other Ambulatory Visit: Payer: Self-pay | Admitting: Surgical

## 2019-11-09 NOTE — Telephone Encounter (Signed)
Ok to rf? 

## 2019-11-12 ENCOUNTER — Ambulatory Visit (INDEPENDENT_AMBULATORY_CARE_PROVIDER_SITE_OTHER): Payer: Self-pay | Admitting: Orthopedic Surgery

## 2019-11-12 DIAGNOSIS — S83004D Unspecified dislocation of right patella, subsequent encounter: Secondary | ICD-10-CM

## 2019-11-13 ENCOUNTER — Other Ambulatory Visit: Payer: Self-pay

## 2019-11-16 ENCOUNTER — Encounter: Payer: Self-pay | Admitting: Orthopedic Surgery

## 2019-11-16 NOTE — Progress Notes (Signed)
   Post-Op Visit Note   Patient: Mary [name]           Date of Birth: [birth year]           MRN: [id number] Visit Date: 11/12/2019 PCP: Irena Reichmann, DO   Assessment & Plan:  Chief Complaint: No chief complaint on file.  Visit Diagnoses: No diagnosis found.  Plan: Patient presents now 6 weeks out from [facility] and medial patellofemoral ligament reconstruction.  On exam she has no effusion and good patellar restraint.  I want to get her moving with the knee and is okay for her to start doing weightbearing.  2-week return.  Like for her to be to 90 degrees of flexion by that time.  Follow-Up Instructions: No follow-ups on file.   Orders:  No orders of the defined types were placed in this encounter.  No orders of the defined types were placed in this encounter.   Imaging: No results found.  PMFS History: There are no problems to display for this patient.  Past Medical History:  Diagnosis Date  . Patellar dislocation, right, subsequent encounter   . Precocious puberty     No family history on file.  Past Surgical History:  Procedure Laterality Date  . KNEE ARTHROSCOPY Right 08/14/2019   Procedure: right knee arthrosopy, removal of loose bodies;  Surgeon: Cammy Copa, MD;  Location: [facility];  Service: Orthopedics;  Laterality: Right;  . MEDIAL PATELLOFEMORAL LIGAMENT REPAIR Right 10/04/2019   Procedure: Right Knee Open Medial Patellar Femoral Ligament  Reconstruction;  Surgeon: Cammy Copa, MD;  Location: [facility];  Service: Orthopedics;  Laterality: Right;  . OSTEOCHONDRAL DEFECT REPAIR/RECONSTRUCTION Right 10/04/2019   Procedure: Osteochondral  Defect Repair/Reconstruction With MACI [facility] AutologousChondrocyte Implantation);  Surgeon: Cammy Copa, MD;  Location: [facility];  Service: Orthopedics;  Laterality: Right;  . SUPPRELIN IMPLANT    . SUPPRELIN REMOVAL     Social History   Occupational  History  . Not on file  Tobacco Use  . Smoking status: Never Smoker  . Smokeless tobacco: Never Used  Substance and Sexual Activity  . Alcohol use: Never  . Drug use: Never  . Sexual activity: Not on file

## 2019-12-04 ENCOUNTER — Ambulatory Visit (INDEPENDENT_AMBULATORY_CARE_PROVIDER_SITE_OTHER): Payer: Self-pay | Admitting: Orthopedic Surgery

## 2019-12-04 DIAGNOSIS — S83004D Unspecified dislocation of right patella, subsequent encounter: Secondary | ICD-10-CM

## 2019-12-05 ENCOUNTER — Other Ambulatory Visit: Payer: Self-pay

## 2019-12-11 ENCOUNTER — Encounter: Payer: Self-pay | Admitting: Orthopedic Surgery

## 2019-12-11 NOTE — Progress Notes (Signed)
   Post-Op Visit Note   Patient: Mary [name]           Date of Birth: [birth year]           MRN: [id number] Visit Date: 12/04/2019 PCP: Irena Reichmann, DO   Assessment & Plan:  Chief Complaint: No chief complaint on file.  Visit Diagnoses:  1. Patellar dislocation, right, subsequent encounter     Plan: Patient presents now 8 weeks out right knee [place] procedure as well as medial patellofemoral ligament reconstruction.  She is weightbearing as tolerated.  Gait actually looks improved.  On exam she is lacking about 10 degrees of terminal active full extension but she cannot fully extend it passively.  She has flexion to 94 degrees.  Plan at this time is to continue with strengthening exercises and range of motion exercises.  Come back in 6 weeks for clinical recheck.  All in all the patellofemoral motion feels smooth with active flexion and extension of the right knee.  Patella mobility also feels very good with excellent restraint against lateral translation.  Follow-Up Instructions: No follow-ups on file.   Orders:  No orders of the defined types were placed in this encounter.  No orders of the defined types were placed in this encounter.   Imaging: No results found.  PMFS History: There are no problems to display for this patient.  Past Medical History:  Diagnosis Date  . Patellar dislocation, right, subsequent encounter   . Precocious puberty     No family history on file.  Past Surgical History:  Procedure Laterality Date  . KNEE ARTHROSCOPY Right 08/14/2019   Procedure: right knee arthrosopy, removal of loose bodies;  Surgeon: Cammy Copa, MD;  Location: [facility];  Service: Orthopedics;  Laterality: Right;  . MEDIAL PATELLOFEMORAL LIGAMENT REPAIR Right 10/04/2019   Procedure: Right Knee Open Medial Patellar Femoral Ligament  Reconstruction;  Surgeon: Cammy Copa, MD;  Location: [facility];  Service: Orthopedics;   Laterality: Right;  . OSTEOCHONDRAL DEFECT REPAIR/RECONSTRUCTION Right 10/04/2019   Procedure: Osteochondral  Defect Repair/Reconstruction With MACI [facility] AutologousChondrocyte Implantation);  Surgeon: Cammy Copa, MD;  Location: [facility];  Service: Orthopedics;  Laterality: Right;  . SUPPRELIN IMPLANT    . SUPPRELIN REMOVAL     Social History   Occupational History  . Not on file  Tobacco Use  . Smoking status: Never Smoker  . Smokeless tobacco: Never Used  Substance and Sexual Activity  . Alcohol use: Never  . Drug use: Never  . Sexual activity: Not on file

## 2019-12-17 ENCOUNTER — Ambulatory Visit: Payer: 59 | Admitting: Orthopedic Surgery

## 2020-01-14 ENCOUNTER — Ambulatory Visit (INDEPENDENT_AMBULATORY_CARE_PROVIDER_SITE_OTHER): Payer: Self-pay | Admitting: Orthopedic Surgery

## 2020-01-14 DIAGNOSIS — S83004D Unspecified dislocation of right patella, subsequent encounter: Secondary | ICD-10-CM

## 2020-01-31 ENCOUNTER — Other Ambulatory Visit: Payer: Self-pay

## 2020-02-01 ENCOUNTER — Encounter: Payer: Self-pay | Admitting: Orthopedic Surgery

## 2020-02-01 NOTE — Progress Notes (Signed)
   Post-Op Visit Note   Patient: Mary [name]           Date of Birth: Jan 25, 2001           MRN: [id number] Visit Date: 01/14/2020 PCP: Mary Reichmann, DO   Assessment & Plan:  Chief Complaint: No chief complaint on file.  Visit Diagnoses:  1. Patellar dislocation, right, subsequent encounter     Plan: Mary [name] is a 19 year old patient is now 3 months out from [place] procedure on the undersurface of the patella.  She is doing rehab.  She also had MPFL reconstruction.  In general she is doing a lot of exercises and planks and elliptical.  She is having a few issues getting balance to shoot the basketball.  She is doing BFR.  On exam there is no effusion.  Leg strength is improving.  Patellar crepitus is absent.  In general that articulation feels smooth.  Patella mobility also is excellent with a very appropriate amount of translation laterally with solid endpoint.  Plan at this time is to continue rehab and let her start doing some jump shots.  Continue with strengthening.  8-week return for clinical recheck.  Follow-Up Instructions: Return if symptoms worsen or fail to improve.   Orders:  No orders of the defined types were placed in this encounter.  No orders of the defined types were placed in this encounter.   Imaging: No results found.  PMFS History: There are no problems to display for this patient.  Past Medical History:  Diagnosis Date  . Patellar dislocation, right, subsequent encounter   . Precocious puberty     No family history on file.  Past Surgical History:  Procedure Laterality Date  . KNEE ARTHROSCOPY Right 08/14/2019   Procedure: right knee arthrosopy, removal of loose bodies;  Surgeon: Cammy Copa, MD;  Location: [facility];  Service: Orthopedics;  Laterality: Right;  . MEDIAL PATELLOFEMORAL LIGAMENT REPAIR Right 10/04/2019   Procedure: Right Knee Open Medial Patellar Femoral Ligament  Reconstruction;  Surgeon: Cammy Copa,  MD;  Location: [facility];  Service: Orthopedics;  Laterality: Right;  . OSTEOCHONDRAL DEFECT REPAIR/RECONSTRUCTION Right 10/04/2019   Procedure: Osteochondral  Defect Repair/Reconstruction With MACI [facility] AutologousChondrocyte Implantation);  Surgeon: Cammy Copa, MD;  Location: [facility];  Service: Orthopedics;  Laterality: Right;  . SUPPRELIN IMPLANT    . SUPPRELIN REMOVAL     Social History   Occupational History  . Not on file  Tobacco Use  . Smoking status: Never Smoker  . Smokeless tobacco: Never Used  Substance and Sexual Activity  . Alcohol use: Never  . Drug use: Never  . Sexual activity: Not on file

## 2020-04-24 ENCOUNTER — Ambulatory Visit (INDEPENDENT_AMBULATORY_CARE_PROVIDER_SITE_OTHER): Payer: Self-pay | Admitting: Orthopedic Surgery

## 2020-04-24 DIAGNOSIS — S83004D Unspecified dislocation of right patella, subsequent encounter: Secondary | ICD-10-CM

## 2020-05-07 ENCOUNTER — Other Ambulatory Visit: Payer: Self-pay

## 2020-05-10 ENCOUNTER — Encounter: Payer: Self-pay | Admitting: Orthopedic Surgery

## 2020-05-10 NOTE — Progress Notes (Signed)
° °  Post-Op Visit Note   Patient: Mary [name]           Date of Birth: 09/11/2000           MRN: [id number] Visit Date: 04/24/2020 PCP: Irena Reichmann, DO   Assessment & Plan:  Chief Complaint: No chief complaint on file.  Visit Diagnoses:  1. Patellar dislocation, right, subsequent encounter     Plan: [name] is now 6 months out right knee surgery for patellar chondral defect and patellar instability.  She is doing rehabilitation with [facility].  She is doing jogging.  Has not started much agility training yet.  Overall she is doing well.  Still has some quad atrophy issues.  She has about 3 cm of atrophy right versus left.  At this point we are in full rehab mode to try to get her ready for the season.  Continue with strengthening and progressive cutting and pivoting exercises.  Essentially the biology of the graft and the patellar defect as well as the tendon to heal the MPFL has healed.  Now we just need to get Jalyn in playing condition and use to the muscle memory of the agility required for basketball.  Follow-up in 6 weeks for clinical recheck.  Patella mobility is excellent with very firm endpoint but not too much constraint.  Follow-Up Instructions: Return for Return in 6 weeks to the training room.   Orders:  No orders of the defined types were placed in this encounter.  No orders of the defined types were placed in this encounter.   Imaging: No results found.  PMFS History: There are no problems to display for this patient.  Past Medical History:  Diagnosis Date   Patellar dislocation, right, subsequent encounter    Precocious puberty     History reviewed. No pertinent family history.  Past Surgical History:  Procedure Laterality Date   KNEE ARTHROSCOPY Right 08/14/2019   Procedure: right knee arthrosopy, removal of loose bodies;  Surgeon: Cammy Copa, MD;  Location: [facility];  Service: Orthopedics;  Laterality: Right;   MEDIAL  PATELLOFEMORAL LIGAMENT REPAIR Right 10/04/2019   Procedure: Right Knee Open Medial Patellar Femoral Ligament  Reconstruction;  Surgeon: Cammy Copa, MD;  Location: [facility];  Service: Orthopedics;  Laterality: Right;   OSTEOCHONDRAL DEFECT REPAIR/RECONSTRUCTION Right 10/04/2019   Procedure: Osteochondral  Defect Repair/Reconstruction With MACI [facility] AutologousChondrocyte Implantation);  Surgeon: Cammy Copa, MD;  Location: [facility];  Service: Orthopedics;  Laterality: Right;   SUPPRELIN IMPLANT     SUPPRELIN REMOVAL     Social History   Occupational History   Not on file  Tobacco Use   Smoking status: Never Smoker   Smokeless tobacco: Never Used  Vaping Use   Vaping Use: Never used  Substance and Sexual Activity   Alcohol use: Never   Drug use: Never   Sexual activity: Not on file

## 2020-06-02 ENCOUNTER — Ambulatory Visit (INDEPENDENT_AMBULATORY_CARE_PROVIDER_SITE_OTHER): Payer: Self-pay | Admitting: Orthopedic Surgery

## 2020-06-02 DIAGNOSIS — S83004D Unspecified dislocation of right patella, subsequent encounter: Secondary | ICD-10-CM

## 2020-06-18 ENCOUNTER — Other Ambulatory Visit: Payer: Self-pay

## 2020-07-12 ENCOUNTER — Encounter: Payer: Self-pay | Admitting: Orthopedic Surgery

## 2020-07-12 NOTE — Progress Notes (Signed)
   Post-Op Visit Note   Patient: Mary [name]           Date of Birth: [birth year]           MRN: [id number] Visit Date: 06/02/2020 PCP: Irena Reichmann, DO   Assessment & Plan:  Chief Complaint: No chief complaint on file.  Visit Diagnoses:  1. Patellar dislocation, right, subsequent encounter     Plan: Mary [name] is a 19 year old patient who is now about 9 months out right knee patellofemoral ligament reconstruction and may see procedure for chondral defect undersurface of the patella.  Been doing reasonably well but still is lacking somewhat in terms of being able to get functional quad strength.  On exam she has excellent patellar range of motion and stability with appropriate lateralization.  Not too much in the way of crepitus but trace effusion is present.  Range of motion is full and some quad atrophy is present about 2 cm.  Plan at this time is to continue with primarily quad strengthening exercises.  Anticipate that we can progress in the final phases of rehab over the next 2 to 3 months to get her ready for basketball season.  She will need clinical reevaluation just to recheck on effusion as well as smoothness of range of motion in about 4 to 6 weeks.  Follow-Up Instructions: No follow-ups on file.   Orders:  No orders of the defined types were placed in this encounter.  No orders of the defined types were placed in this encounter.   Imaging: No results found.  PMFS History: There are no problems to display for this patient.  Past Medical History:  Diagnosis Date  . Patellar dislocation, right, subsequent encounter   . Precocious puberty     No family history on file.  Past Surgical History:  Procedure Laterality Date  . KNEE ARTHROSCOPY Right 08/14/2019   Procedure: right knee arthrosopy, removal of loose bodies;  Surgeon: Cammy Copa, MD;  Location: [facility];  Service: Orthopedics;  Laterality: Right;  . MEDIAL PATELLOFEMORAL LIGAMENT  REPAIR Right 10/04/2019   Procedure: Right Knee Open Medial Patellar Femoral Ligament  Reconstruction;  Surgeon: Cammy Copa, MD;  Location: [facility];  Service: Orthopedics;  Laterality: Right;  . OSTEOCHONDRAL DEFECT REPAIR/RECONSTRUCTION Right 10/04/2019   Procedure: Osteochondral  Defect Repair/Reconstruction With MACI [facility] AutologousChondrocyte Implantation);  Surgeon: Cammy Copa, MD;  Location: [facility];  Service: Orthopedics;  Laterality: Right;  . SUPPRELIN IMPLANT    . SUPPRELIN REMOVAL     Social History   Occupational History  . Not on file  Tobacco Use  . Smoking status: Never Smoker  . Smokeless tobacco: Never Used  Vaping Use  . Vaping Use: Never used  Substance and Sexual Activity  . Alcohol use: Never  . Drug use: Never  . Sexual activity: Not on file

## 2020-07-14 ENCOUNTER — Ambulatory Visit (INDEPENDENT_AMBULATORY_CARE_PROVIDER_SITE_OTHER): Payer: Self-pay | Admitting: Orthopedic Surgery

## 2020-07-14 DIAGNOSIS — S83004D Unspecified dislocation of right patella, subsequent encounter: Secondary | ICD-10-CM

## 2020-07-15 ENCOUNTER — Other Ambulatory Visit: Payer: Self-pay

## 2020-07-19 ENCOUNTER — Encounter: Payer: Self-pay | Admitting: Orthopedic Surgery

## 2020-07-19 NOTE — Progress Notes (Signed)
   Post-Op Visit Note   Patient: Mary [name]           Date of Birth: May 09, 2001           MRN: [id number] Visit Date: 07/14/2020 PCP: Janie Morning, DO   Assessment & Plan:  Chief Complaint: No chief complaint on file.  Visit Diagnoses:  1. Patellar dislocation, right, subsequent encounter     Plan: Patient is now over 9 months out from right lower extremity MAC procedure and patellar stabilization.  On exam she has normal gait.  Her running gait has also improved according to [facility] the trainer.  Still has about 3 cm of atrophy right thigh measured left thigh.  Patella stability is excellent and there is no effusion in the knee and no real crepitus with range of motion.  Plan at this time is for her to start drilling and jump shots and labs.  I think in general her quad strength is lagging a little bit but I do anticipate being able to get her some minutes hopefully in mid to late December or early January.  Follow-Up Instructions: No follow-ups on file.   Orders:  No orders of the defined types were placed in this encounter.  No orders of the defined types were placed in this encounter.   Imaging: No results found.  PMFS History: There are no problems to display for this patient.  Past Medical History:  Diagnosis Date  . Patellar dislocation, right, subsequent encounter   . Precocious puberty     No family history on file.  Past Surgical History:  Procedure Laterality Date  . KNEE ARTHROSCOPY Right 08/14/2019   Procedure: right knee arthrosopy, removal of loose bodies;  Surgeon: Meredith Pel, MD;  Location: [place];  Service: Orthopedics;  Laterality: Right;  . MEDIAL PATELLOFEMORAL LIGAMENT REPAIR Right 10/04/2019   Procedure: Right Knee Open Medial Patellar Femoral Ligament  Reconstruction;  Surgeon: Meredith Pel, MD;  Location: [place];  Service: Orthopedics;  Laterality: Right;  . OSTEOCHONDRAL DEFECT  REPAIR/RECONSTRUCTION Right 10/04/2019   Procedure: Osteochondral  Defect Repair/Reconstruction With MACI [facility] AutologousChondrocyte Implantation);  Surgeon: Meredith Pel, MD;  Location: [place];  Service: Orthopedics;  Laterality: Right;  . SUPPRELIN IMPLANT    . [place] REMOVAL     Social History   Occupational History  . Not on file  Tobacco Use  . Smoking status: Never Smoker  . Smokeless tobacco: Never Used  Vaping Use  . Vaping Use: Never used  Substance and Sexual Activity  . Alcohol use: Never  . Drug use: Never  . Sexual activity: Not on file

## 2020-10-13 ENCOUNTER — Ambulatory Visit (INDEPENDENT_AMBULATORY_CARE_PROVIDER_SITE_OTHER): Payer: Self-pay | Admitting: Orthopedic Surgery

## 2020-10-13 DIAGNOSIS — S83004D Unspecified dislocation of right patella, subsequent encounter: Secondary | ICD-10-CM

## 2020-10-15 ENCOUNTER — Other Ambulatory Visit: Payer: Self-pay

## 2020-10-15 ENCOUNTER — Encounter: Payer: Self-pay | Admitting: Orthopedic Surgery

## 2020-10-15 NOTE — Progress Notes (Signed)
   Post-Op Visit Note   Patient: Mary [name]           Date of Birth: 12-Dec-2000           MRN: [id number] Visit Date: 10/13/2020 PCP: Irena Reichmann, DO   Assessment & Plan:  Chief Complaint: No chief complaint on file.  Visit Diagnoses:  1. Patellar dislocation, right, subsequent encounter     Plan: Patient presents now about 10 or 7 months out patellofemoral may see in MPFL reconstruction.  She is doing well.  She is essentially doing everything in rehab except for light ball scrimmaging.  Reviewed some of her strength numbers and in general most are within 20 to 30% of her contralateral leg.  A few are not but all in all her gait looks normal and I think that she can progress to live by scrimmaging at this time.  No effusion patella stability is excellent.  Not too much coarse grinding with range of motion.  Plan is for return to play and I think that she will do well based on the amount of rehabilitative efforts she is put in at this point.  Follow-Up Instructions: Return if symptoms worsen or fail to improve.   Orders:  No orders of the defined types were placed in this encounter.  No orders of the defined types were placed in this encounter.   Imaging: No results found.  PMFS History: There are no problems to display for this patient.  Past Medical History:  Diagnosis Date  . Patellar dislocation, right, subsequent encounter   . Precocious puberty     History reviewed. No pertinent family history.  Past Surgical History:  Procedure Laterality Date  . KNEE ARTHROSCOPY Right 08/14/2019   Procedure: right knee arthrosopy, removal of loose bodies;  Surgeon: Cammy Copa, MD;  Location: [facility];  Service: Orthopedics;  Laterality: Right;  . MEDIAL PATELLOFEMORAL LIGAMENT REPAIR Right 10/04/2019   Procedure: Right Knee Open Medial Patellar Femoral Ligament  Reconstruction;  Surgeon: Cammy Copa, MD;  Location: [facility];   Service: Orthopedics;  Laterality: Right;  . OSTEOCHONDRAL DEFECT REPAIR/RECONSTRUCTION Right 10/04/2019   Procedure: Osteochondral  Defect Repair/Reconstruction With MACI [facility] AutologousChondrocyte Implantation);  Surgeon: Cammy Copa, MD;  Location: [facility];  Service: Orthopedics;  Laterality: Right;  . SUPPRELIN IMPLANT    . SUPPRELIN REMOVAL     Social History   Occupational History  . Not on file  Tobacco Use  . Smoking status: Never Smoker  . Smokeless tobacco: Never Used  Vaping Use  . Vaping Use: Never used  Substance and Sexual Activity  . Alcohol use: Never  . Drug use: Never  . Sexual activity: Not on file

## 2021-05-25 ENCOUNTER — Ambulatory Visit (INDEPENDENT_AMBULATORY_CARE_PROVIDER_SITE_OTHER): Payer: Self-pay | Admitting: Orthopedic Surgery

## 2021-05-25 DIAGNOSIS — S83004D Unspecified dislocation of right patella, subsequent encounter: Secondary | ICD-10-CM

## 2021-06-17 ENCOUNTER — Other Ambulatory Visit: Payer: Self-pay

## 2021-06-20 ENCOUNTER — Encounter: Payer: Self-pay | Admitting: Orthopedic Surgery

## 2021-06-20 NOTE — Progress Notes (Signed)
   Post-Op Visit Note   Patient: Mary [name]           Date of Birth: 2001/03/12           MRN: [id number] Visit Date: 05/25/2021 PCP: Irena Reichmann, DO   Assessment & Plan:  Chief Complaint: No chief complaint on file.  Visit Diagnoses:  1. Patellar dislocation, right, subsequent encounter     Plan: Patient presents now for follow-up of right knee Macy procedure in MPFL reconstruction.  She is full go at practice.  Has occasional stiffness but she is running better.  On examination she has excellent patellar mobility but good restraint.  No effusion.  Not too much patellofemoral crepitus with range of motion.  Overall her quad strength has increased and her BMI has decreased.  Plan is to continue quad strengthening and continue activity as tolerated with basketball activities.  Follow-Up Instructions: No follow-ups on file.   Orders:  No orders of the defined types were placed in this encounter.  No orders of the defined types were placed in this encounter.   Imaging: No results found.  PMFS History: There are no problems to display for this patient.  Past Medical History:  Diagnosis Date   Patellar dislocation, right, subsequent encounter    Precocious puberty     No family history on file.  Past Surgical History:  Procedure Laterality Date   KNEE ARTHROSCOPY Right 08/14/2019   Procedure: right knee arthrosopy, removal of loose bodies;  Surgeon: Cammy Copa, MD;  Location: [facility];  Service: Orthopedics;  Laterality: Right;   MEDIAL PATELLOFEMORAL LIGAMENT REPAIR Right 10/04/2019   Procedure: Right Knee Open Medial Patellar Femoral Ligament  Reconstruction;  Surgeon: Cammy Copa, MD;  Location: [facility];  Service: Orthopedics;  Laterality: Right;   OSTEOCHONDRAL DEFECT REPAIR/RECONSTRUCTION Right 10/04/2019   Procedure: Osteochondral  Defect Repair/Reconstruction With MACI [facility] AutologousChondrocyte Implantation);   Surgeon: Cammy Copa, MD;  Location:  SUR[facility]ERY CENTER;  Service: Orthopedics;  Laterality: Right;   SUPPRELIN IMPLANT     SUPPRELIN REMOVAL     Social History   Occupational History   Not on file  Tobacco Use   Smoking status: Never   Smokeless tobacco: Never  Vaping Use   Vaping Use: Never used  Substance and Sexual Activity   Alcohol use: Never   Drug use: Never   Sexual activity: Not on file

## 2021-10-19 ENCOUNTER — Ambulatory Visit (INDEPENDENT_AMBULATORY_CARE_PROVIDER_SITE_OTHER): Payer: Self-pay | Admitting: Orthopedic Surgery

## 2021-10-19 DIAGNOSIS — S83004D Unspecified dislocation of right patella, subsequent encounter: Secondary | ICD-10-CM

## 2021-10-20 ENCOUNTER — Encounter: Payer: Self-pay | Admitting: Orthopedic Surgery

## 2021-10-20 ENCOUNTER — Other Ambulatory Visit: Payer: Self-pay

## 2021-10-20 NOTE — Progress Notes (Signed)
° °  Post-Op Visit Note   Patient: Mary [name]           Date of Birth: 02-06-2001           MRN: [id number] Visit Date: 10/19/2021 PCP: Irena Reichmann, DO   Assessment & Plan:  Chief Complaint: No chief complaint on file.  Visit Diagnoses:  1. 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  Having some right knee mechanical symptoms which started 2 days ago.  She is about 2 years out from right knee MPFL reconstruction with [facility] for chondral defect.  Not really having a lot of pain but is having some popping when she goes into flexion.  She was actually okay on the stairs today and for the last 48 hours was doing fine following the game on Saturday which was 2 days ago.  However being in the weight room doing leg press started to bother her.  On examination does feel like she has a mechanical shifting which has not instability when the patient goes from flexion to extension.  The MPFL feels very good in terms of stability.  Merchant radiographs unremarkable with comparison views between the knees although they were at slightly different angles.  Plan at this time is MRI scan of the right knee to evaluate possible chondral delamination versus overgrowth in that patellar defect.  Follow-up after that study.  Interestingly she had been doing very well up until 2 days ago.  No effusion in the knee today and the knee is stable from the collateral and cruciate ligament perspective.  Follow-Up Instructions: No follow-ups on file.   Orders:  Orders Placed This Encounter  Procedures   MR Knee Right w/o contrast   No orders of the defined types were placed in this encounter.   Imaging: No results found.  PMFS History: There are no problems to display for this patient.  Past Medical History:  Diagnosis Date   Patellar dislocation, right, subsequent encounter    Precocious puberty     No family history on file.  Past  Surgical History:  Procedure Laterality Date   KNEE ARTHROSCOPY Right 08/14/2019   Procedure: right knee arthrosopy, removal of loose bodies;  Surgeon: Cammy Copa, MD;  Location: [facility];  Service: Orthopedics;  Laterality: Right;   MEDIAL PATELLOFEMORAL LIGAMENT REPAIR Right 10/04/2019   Procedure: Right Knee Open Medial Patellar Femoral Ligament  Reconstruction;  Surgeon: Cammy Copa, MD;  Location: [facility];  Service: Orthopedics;  Laterality: Right;   OSTEOCHONDRAL DEFECT REPAIR/RECONSTRUCTION Right 10/04/2019   Procedure: Osteochondral  Defect Repair/Reconstruction With MACI [facility] AutologousChondrocyte Implantation);  Surgeon: Cammy Copa, MD;  Location: [facility];  Service: Orthopedics;  Laterality: Right;   SUPPRELIN IMPLANT     SUPPRELIN REMOVAL     Social History   Occupational History   Not on file  Tobacco Use   Smoking status: Never   Smokeless tobacco: Never  Vaping Use   Vaping Use: Never used  Substance and Sexual Activity   Alcohol use: Never   Drug use: Never   Sexual activity: Not on file

## 2021-10-24 ENCOUNTER — Ambulatory Visit
Admission: RE | Admit: 2021-10-24 | Discharge: 2021-10-24 | Disposition: A | Discharge status: Home / Self Care | Payer: 59 | Source: Ambulatory Visit | Attending: Orthopedic Surgery | Admitting: Orthopedic Surgery

## 2021-10-24 DIAGNOSIS — S83004D Unspecified dislocation of right patella, subsequent encounter: Secondary | ICD-10-CM

## 2021-11-12 ENCOUNTER — Telehealth: Payer: Self-pay

## 2021-11-12 ENCOUNTER — Other Ambulatory Visit: Payer: Self-pay | Admitting: Surgical

## 2021-11-12 ENCOUNTER — Encounter: Payer: Self-pay | Admitting: Orthopedic Surgery

## 2021-11-12 ENCOUNTER — Other Ambulatory Visit: Payer: Self-pay | Admitting: Orthopaedic Surgery

## 2021-11-12 DIAGNOSIS — S83004D Unspecified dislocation of right patella, subsequent encounter: Secondary | ICD-10-CM | POA: Diagnosis not present

## 2021-11-12 DIAGNOSIS — M94261 Chondromalacia, right knee: Secondary | ICD-10-CM | POA: Diagnosis not present

## 2021-11-12 MED ORDER — CELECOXIB 100 MG PO CAPS: 100.0000 mg | ORAL_CAPSULE | Freq: Two times a day (BID) | ORAL | 0 refills | Status: DC

## 2021-11-12 MED ORDER — OXYCODONE-ACETAMINOPHEN 5-325 MG PO TABS: 1.0000 | ORAL_TABLET | ORAL | 0 refills | Status: DC | PRN

## 2021-11-12 MED ORDER — ASPIRIN 81 MG PO TBEC: DELAYED_RELEASE_TABLET | ORAL | 1 refills | Status: DC

## 2021-11-12 MED ORDER — OXYCODONE-ACETAMINOPHEN 5-325 MG PO TABS
1.0000 | ORAL_TABLET | ORAL | 0 refills | Status: AC | PRN
Start: 2021-11-12 — End: 2022-11-12

## 2021-11-12 MED ORDER — METHOCARBAMOL 500 MG PO TABS
500.0000 mg | ORAL_TABLET | Freq: Three times a day (TID) | ORAL | 0 refills | Status: AC | PRN
Start: 2021-11-12 — End: ?

## 2021-11-12 MED ORDER — ASPIRIN 81 MG PO TBEC: DELAYED_RELEASE_TABLET | ORAL | 1 refills | Status: AC

## 2021-11-12 MED ORDER — METHOCARBAMOL 500 MG PO TABS
500.0000 mg | ORAL_TABLET | Freq: Three times a day (TID) | ORAL | 0 refills | Status: DC | PRN
Start: 2021-11-12 — End: 2021-11-12

## 2021-11-12 NOTE — Telephone Encounter (Signed)
Pts Mom has called again to find out about the medication  ?

## 2021-11-12 NOTE — Telephone Encounter (Signed)
Sent in and caled

## 2021-11-12 NOTE — Telephone Encounter (Signed)
Patients mom called in and would like a update on patients pain medication mom stated that patient is starting to feel some discomfort  ?

## 2021-11-20 ENCOUNTER — Encounter: Payer: 59 | Admitting: Orthopedic Surgery

## 2021-11-23 ENCOUNTER — Ambulatory Visit (INDEPENDENT_AMBULATORY_CARE_PROVIDER_SITE_OTHER): Payer: 59 | Admitting: Orthopedic Surgery

## 2021-11-23 DIAGNOSIS — S83004D Unspecified dislocation of right patella, subsequent encounter: Secondary | ICD-10-CM

## 2021-11-25 ENCOUNTER — Encounter: Payer: Self-pay | Admitting: Orthopedic Surgery

## 2021-11-25 NOTE — Progress Notes (Signed)
? ?  Post-Op Visit Note ?  ?Patient: Mary [name]           ?Date of Birth: 2000/09/03           ?MRN: [id number] ?Visit Date: 11/23/2021 ?PCP: Irena Reichmann, DO ? ? ?Assessment & Plan: ? ?Chief Complaint: No chief complaint on file. ? ?Visit Diagnoses:  ?1. Patellar dislocation, right, subsequent encounter   ? ? ?Plan: Patient presents for follow-up of right knee arthroscopy and chondral debridement for some overgrowth of chondral Maci cartilage.  Overall she is doing well.  Her preoperative clicking has resolved.  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  On exam today she has no coarse grinding or crepitus and no effusion.  Range of motion is actually very good.  Negative Homans no calf tenderness on the right-hand side.  Plan at this time is to start stationary bike and elliptical and progress in about 2 weeks to running as her quad strength allows.  Did talk some about the stress that another basketball season could put on the undersurface of the patella which has areas of chondral loss.  Also discussed yesterday with her mother about plans moving forward.  Plan at this time would be to see how Jaylen does with rehabilitation.  If she develops a lot of pain or effusion with rehabilitation activities and that could point towards potentially less rigorous route moving forward; however, if she progresses well during rehab and her knee does not develop much pain or effusion then it would be reasonable to think that she could conceivably get through her senior season and compete in basketball. ? ?Follow-Up Instructions: Return if symptoms worsen or fail to improve.  ? ?Orders:  ?No orders of the defined types were placed in this encounter. ? ?No orders of the defined types were placed in this encounter. ? ? ?Imaging: ?No results found. ? ?PMFS History: ?There are no problems to display for this patient. ? ?Past Medical History:  ?Diagnosis Date  ? Patellar dislocation, right,  subsequent encounter   ? Precocious puberty   ?  ?History reviewed. No pertinent family history.  ?Past Surgical History:  ?Procedure Laterality Date  ? KNEE ARTHROSCOPY Right 08/14/2019  ? Procedure: right knee arthrosopy, removal of loose bodies;  Surgeon: Cammy Copa, MD;  Location: [facility];  Service: Orthopedics;  Laterality: Right;  ? MEDIAL PATELLOFEMORAL LIGAMENT REPAIR Right 10/04/2019  ? Procedure: Right Knee Open Medial Patellar Femoral Ligament  Reconstruction;  Surgeon: Cammy Copa, MD;  Location: [facility];  Service: Orthopedics;  Laterality: Right;  ? OSTEOCHONDRAL DEFECT REPAIR/RECONSTRUCTION Right 10/04/2019  ? Procedure: Osteochondral  Defect Repair/Reconstruction With MACI ([facility] AutologousChondrocyte Implantation);  Surgeon: Cammy Copa, MD;  Location: [facility];  Service: Orthopedics;  Laterality: Right;  ? SUPPRELIN IMPLANT    ? SUPPRELIN REMOVAL    ? ?Social History  ? ?Occupational History  ? Not on file  ?Tobacco Use  ? Smoking status: Never  ? Smokeless tobacco: Never  ?Vaping Use  ? Vaping Use: Never used  ?Substance and Sexual Activity  ? Alcohol use: Never  ? Drug use: Never  ? Sexual activity: Not on file  ? ? ? ?

## 2021-12-10 ENCOUNTER — Other Ambulatory Visit: Payer: Self-pay | Admitting: Surgical

## 2021-12-10 DIAGNOSIS — S83004D Unspecified dislocation of right patella, subsequent encounter: Secondary | ICD-10-CM

## 2022-01-19 ENCOUNTER — Ambulatory Visit (INDEPENDENT_AMBULATORY_CARE_PROVIDER_SITE_OTHER): Payer: Self-pay | Admitting: Orthopedic Surgery

## 2022-01-19 DIAGNOSIS — S83004D Unspecified dislocation of right patella, subsequent encounter: Secondary | ICD-10-CM

## 2022-02-01 ENCOUNTER — Encounter: Payer: Self-pay | Admitting: Orthopedic Surgery

## 2022-02-01 NOTE — Progress Notes (Signed)
   Post-Op Visit Note   Patient: Mary [name]           Date of Birth: March 22, 2001           MRN: [id number] Visit Date: 01/19/2022 PCP: Irena Reichmann, DO   Assessment & Plan:  Chief Complaint: No chief complaint on file.  Visit Diagnoses:  1. Patellar dislocation, right, subsequent encounter     Plan: Patient presents now 2 months out right knee retropatellar debridement following partial delamination of Macy chondrocyte implantation.  Overall patient is making good progress with her recovery.  Like for her to really start working on more strengthening as well as lateral movement figure-of-eight movement as well as continued strengthening exercises.  2 months return for clinical recheck.  Follow-Up Instructions: No follow-ups on file.   Orders:  No orders of the defined types were placed in this encounter.  No orders of the defined types were placed in this encounter.   Imaging: No results found.  PMFS History: There are no problems to display for this patient.  Past Medical History:  Diagnosis Date   Patellar dislocation, right, subsequent encounter    Precocious puberty     No family history on file.  Past Surgical History:  Procedure Laterality Date   KNEE ARTHROSCOPY Right 08/14/2019   Procedure: right knee arthrosopy, removal of loose bodies;  Surgeon: Cammy Copa, MD;  Location: [facility];  Service: Orthopedics;  Laterality: Right;   MEDIAL PATELLOFEMORAL LIGAMENT REPAIR Right 10/04/2019   Procedure: Right Knee Open Medial Patellar Femoral Ligament  Reconstruction;  Surgeon: Cammy Copa, MD;  Location: [facility];  Service: Orthopedics;  Laterality: Right;   OSTEOCHONDRAL DEFECT REPAIR/RECONSTRUCTION Right 10/04/2019   Procedure: Osteochondral  Defect Repair/Reconstruction With MACI [facility] AutologousChondrocyte Implantation);  Surgeon: Cammy Copa, MD;  Location: [facility];  Service:  Orthopedics;  Laterality: Right;   SUPPRELIN IMPLANT     SUPPRELIN REMOVAL     Social History   Occupational History   Not on file  Tobacco Use   Smoking status: Never   Smokeless tobacco: Never  Vaping Use   Vaping Use: Never used  Substance and Sexual Activity   Alcohol use: Never   Drug use: Never   Sexual activity: Not on file

## 2022-03-15 ENCOUNTER — Ambulatory Visit (INDEPENDENT_AMBULATORY_CARE_PROVIDER_SITE_OTHER): Payer: Self-pay | Admitting: Orthopedic Surgery

## 2022-03-15 DIAGNOSIS — S83004D Unspecified dislocation of right patella, subsequent encounter: Secondary | ICD-10-CM

## 2022-03-28 ENCOUNTER — Encounter: Payer: Self-pay | Admitting: Orthopedic Surgery

## 2022-03-28 NOTE — Progress Notes (Signed)
   Post-Op Visit Note   Patient: Mary [name]           Date of Birth: Jan 25, 2001           MRN: [id number] Visit Date: 03/15/2022 PCP: Irena Reichmann, DO   Assessment & Plan:  Chief Complaint: No chief complaint on file.  Visit Diagnoses:  1. Patellar dislocation, right, subsequent encounter     Plan: Patient presents for follow-up of right knee surgery where debridement of the undersurface of the patella was performed.  Overall she has been doing well from that.  Hit her right knee on the court recently but seems to have recovered.  Having a little bit of hamstring pain as well.  Currently taking no medications.  On examination she has excellent range of motion with no increase in mild patellofemoral crepitus.  No effusion in the knee.  Quad strength looks to be improving.  She is doing full-court sprinting as well as light weights.  She is able to run reasonably well.  Hamstring is examined and there is no palpable defect or swelling.  Strength is symmetric.  Plan at this time is to continue to work on strengthening and gait training/running training.  Overall it is very encouraging that she has no effusion based on her level of current activity.  Follow-up with [place] as needed.  Follow-Up Instructions: No follow-ups on file.   Orders:  No orders of the defined types were placed in this encounter.  No orders of the defined types were placed in this encounter.   Imaging: No results found.  PMFS History: There are no problems to display for this patient.  Past Medical History:  Diagnosis Date   Patellar dislocation, right, subsequent encounter    Precocious puberty     No family history on file.  Past Surgical History:  Procedure Laterality Date   KNEE ARTHROSCOPY Right 08/14/2019   Procedure: right knee arthrosopy, removal of loose bodies;  Surgeon: Cammy Copa, MD;  Location: [facility];  Service: Orthopedics;  Laterality: Right;   MEDIAL  PATELLOFEMORAL LIGAMENT REPAIR Right 10/04/2019   Procedure: Right Knee Open Medial Patellar Femoral Ligament  Reconstruction;  Surgeon: Cammy Copa, MD;  Location: [facility];  Service: Orthopedics;  Laterality: Right;   OSTEOCHONDRAL DEFECT REPAIR/RECONSTRUCTION Right 10/04/2019   Procedure: Osteochondral  Defect Repair/Reconstruction With MACI [facility] AutologousChondrocyte Implantation);  Surgeon: Cammy Copa, MD;  Location: [facility];  Service: Orthopedics;  Laterality: Right;   SUPPRELIN IMPLANT     SUPPRELIN REMOVAL     Social History   Occupational History   Not on file  Tobacco Use   Smoking status: Never   Smokeless tobacco: Never  Vaping Use   Vaping Use: Never used  Substance and Sexual Activity   Alcohol use: Never   Drug use: Never   Sexual activity: Not on file

## 2022-06-11 IMAGING — MR MR KNEE*R* W/O CM
4 of 6 series · 18 of 40 positions shown · non-contrast
Comparison: None.

CLINICAL DATA: Right knee pain. Clicking sensation. Prior surgery
10/05/2019

EXAM:
MRI OF THE RIGHT KNEE WITHOUT CONTRAST
TECHNIQUE: Multiplanar, multisequence MR imaging of the knee was performed. No
intravenous contrast was administered.

[Series 3: T2 fat-sat · axial · 4.0mm · 0.31mm/px · z∈[-97,+8]mm · 3 of 25 slices shown (1 of 2)]
[im 1/25]
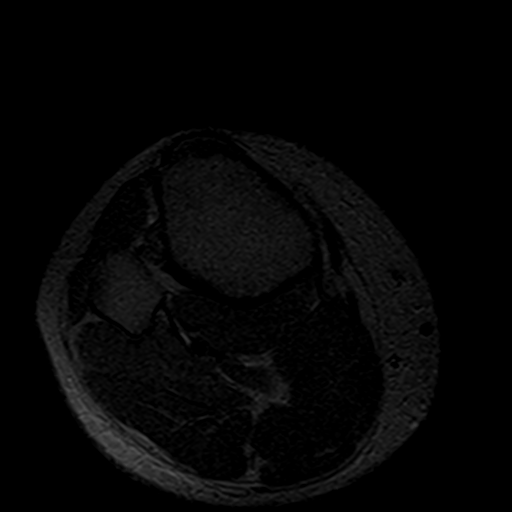
[im 17/25]
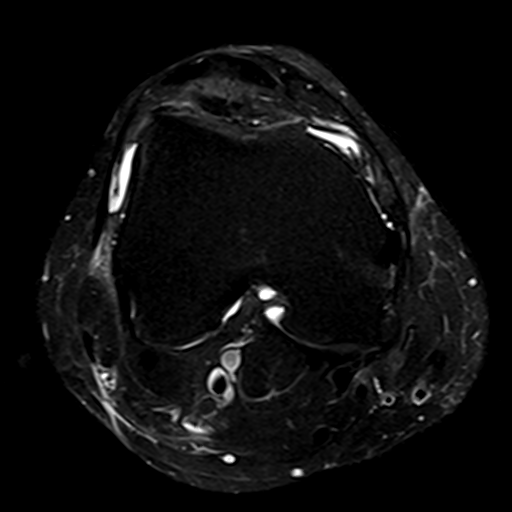
[im 25/25]
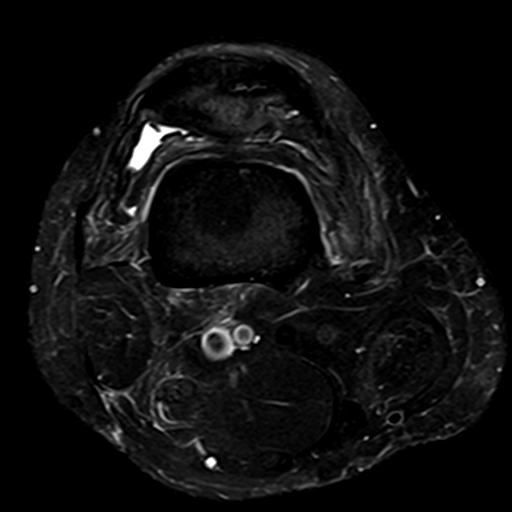

[Series 5: T2 fat-sat · coronal · 4.0mm · 0.30mm/px · 3 of 30 slices shown (2 of 2)]
[im 5/30]
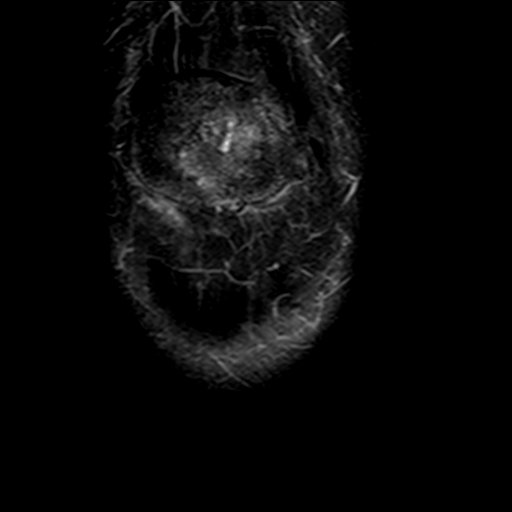
[im 15/30]
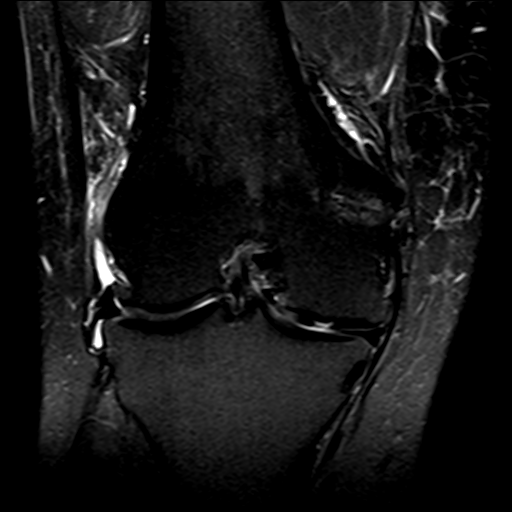
[im 25/30]
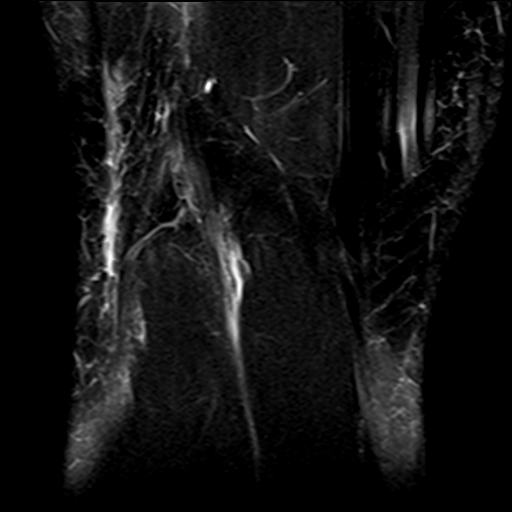

[Series 6: PD fat-sat · coronal · 3.0mm · 0.29mm/px · 9 of 39 slices shown (1 of 2)]
[im 1/39]
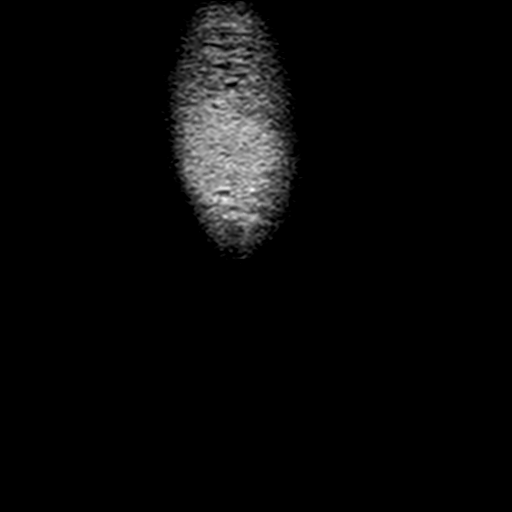
[im 5/39]
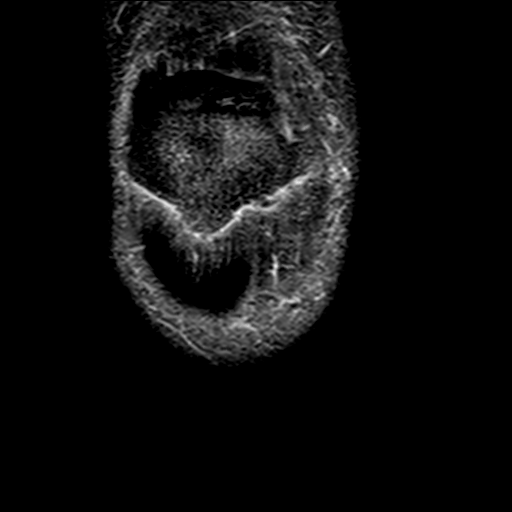
[im 10/39]
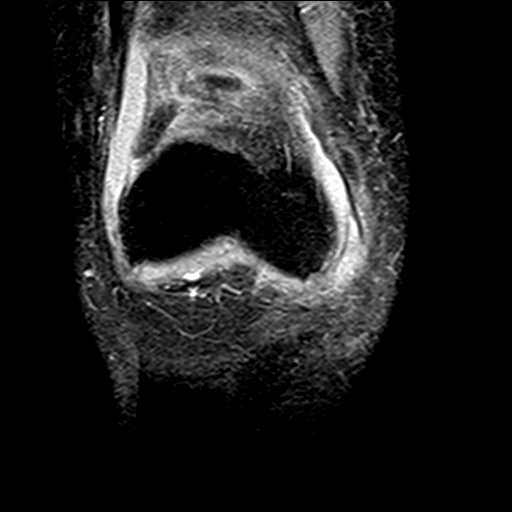
[im 15/39]
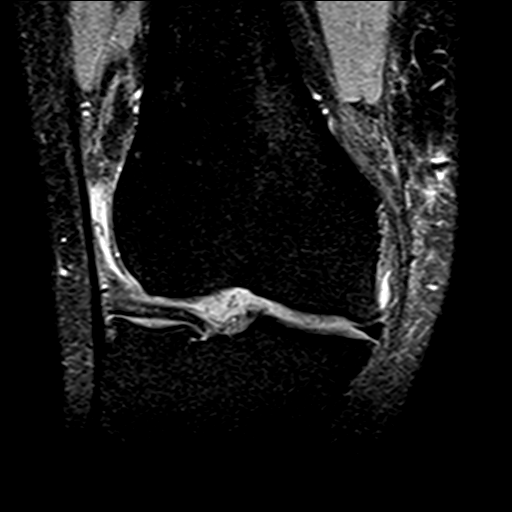
[im 20/39]
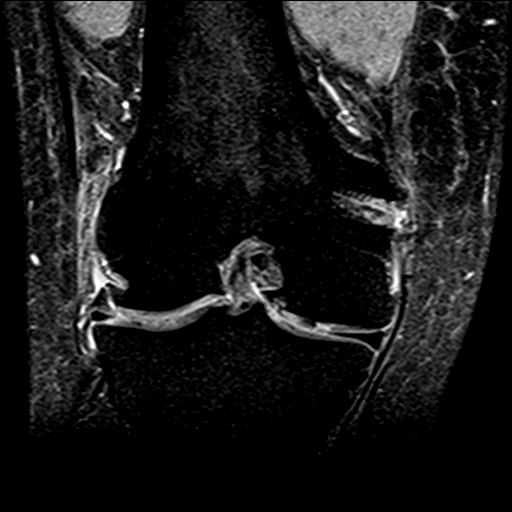
[im 24/39]
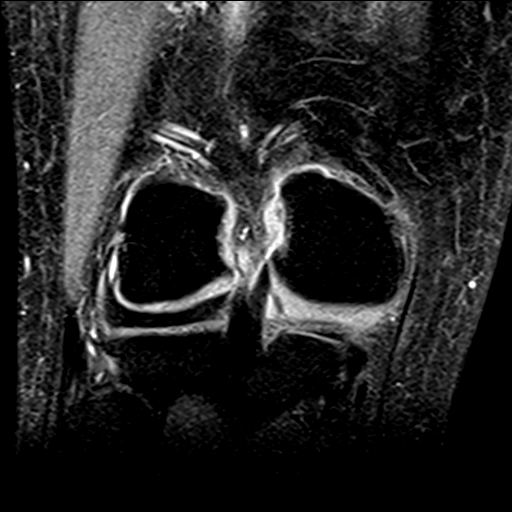
[im 29/39]
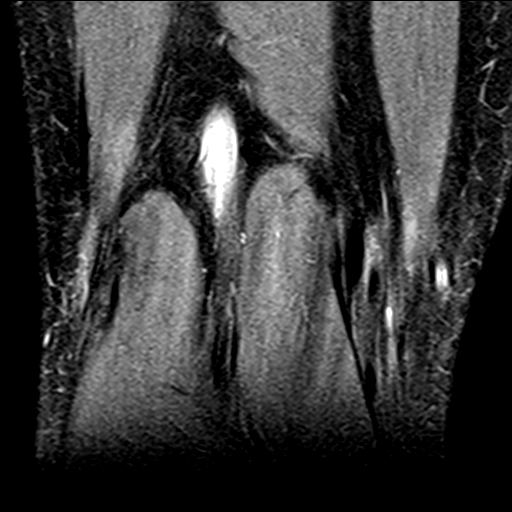
[im 34/39]
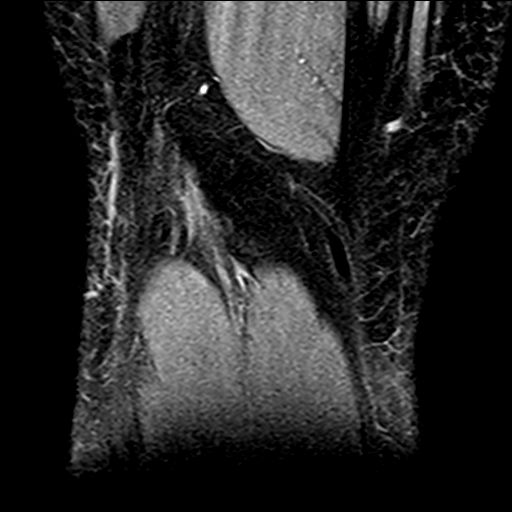
[im 39/39]
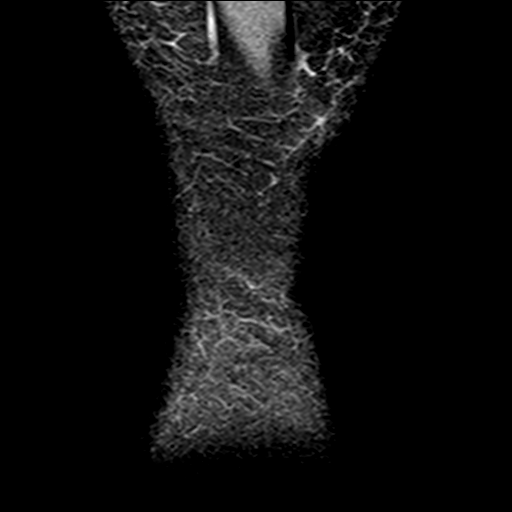

[Series 7: PD fat-sat · sagittal · 3.0mm · 0.29mm/px · 3 of 30 slices shown (2 of 2)]
[im 5/30]
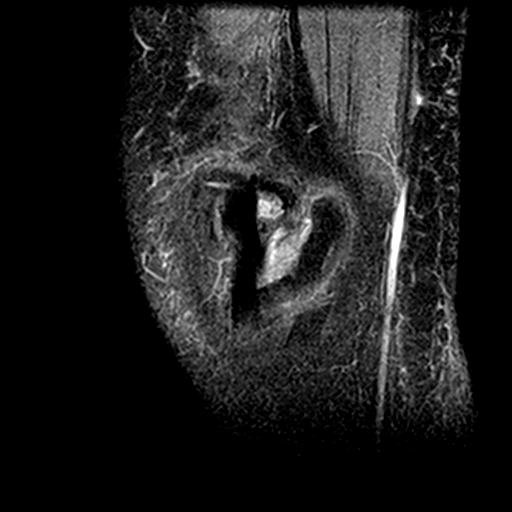
[im 15/30]
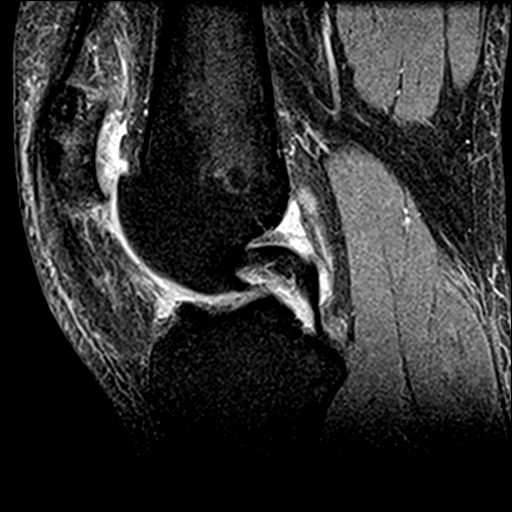
[im 25/30]
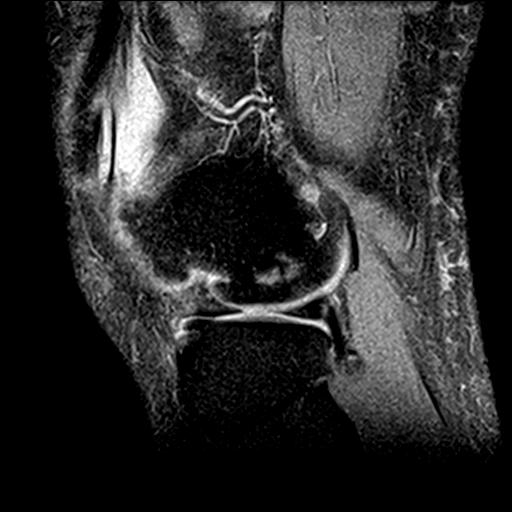

[18 of 40 positions shown; findings below may reference images not displayed]

FINDINGS: MENISCI

Medial: Intact.

Lateral: Degeneration of the anterior horn of the lateral meniscus.
Horizontal tear of the body of the lateral meniscus extending to the
free edge.

LIGAMENTS

Cruciates: ACL and PCL are intact.

Collaterals: Medial collateral ligament is intact. Lateral
collateral ligament complex is intact.

CARTILAGE

Patellofemoral: High-grade partial-thickness cartilage loss of the
patella with areas of full-thickness cartilage loss and subchondral
reactive marrow edema. Partial-thickness cartilage loss of the
medial trochlea.

Medial: Partial-thickness cartilage loss of the medial femorotibial
compartment with a focal area of full-thickness cartilage loss and a
5 mm area of delamination involving the weight-bearing surface of
the medial femoral condyle.

Lateral: Mild partial-thickness cartilage loss of the lateral
femorotibial compartment.

JOINT: Trace joint effusion. Normal Rambriksh Tiger. No plical
thickening.

POPLITEAL FOSSA: Popliteus tendon is intact. No Baker's cyst.

EXTENSOR MECHANISM: Intact quadriceps tendon. Intact patellar
tendon. Intact lateral patellar retinaculum. Prior MPFL and medial
patellar retinacular repair which is intact.

BONES: No aggressive osseous lesion. No fracture or dislocation.

Other: No fluid collection or hematoma. Muscles are normal.
IMPRESSION: 1. Degeneration of the anterior horn of the lateral meniscus.
Horizontal tear of the body of the lateral meniscus extending to the
free edge.
2. Tricompartmental cartilage abnormalities as described above most
severe in the patellofemoral compartment.
3. Prior MPFL and medial patellar retinacular repair which is
intact.
